# Patient Record
Sex: Female | Born: 1970 | Race: Black or African American | Hispanic: No | Marital: Married | Smoking: Current every day smoker
Health system: Southern US, Community
[De-identification: ages and names within clinical notes are randomized; demographics above are authoritative.]

## PROBLEM LIST (undated history)

## (undated) DIAGNOSIS — J40 Bronchitis, not specified as acute or chronic: Secondary | ICD-10-CM

## (undated) DIAGNOSIS — F32A Depression, unspecified: Secondary | ICD-10-CM

## (undated) DIAGNOSIS — M51369 Other intervertebral disc degeneration, lumbar region without mention of lumbar back pain or lower extremity pain: Secondary | ICD-10-CM

## (undated) DIAGNOSIS — R200 Anesthesia of skin: Secondary | ICD-10-CM

## (undated) DIAGNOSIS — I10 Essential (primary) hypertension: Secondary | ICD-10-CM

## (undated) DIAGNOSIS — M5136 Other intervertebral disc degeneration, lumbar region: Secondary | ICD-10-CM

## (undated) DIAGNOSIS — M1990 Unspecified osteoarthritis, unspecified site: Secondary | ICD-10-CM

## (undated) DIAGNOSIS — F329 Major depressive disorder, single episode, unspecified: Secondary | ICD-10-CM

## (undated) DIAGNOSIS — F419 Anxiety disorder, unspecified: Secondary | ICD-10-CM

## (undated) HISTORY — PX: DILATION AND CURETTAGE OF UTERUS: SHX78

## (undated) HISTORY — PX: OTHER SURGICAL HISTORY: SHX169

---

## 1998-06-06 ENCOUNTER — Emergency Department (HOSPITAL_COMMUNITY): Admission: EM | Admit: 1998-06-06 | Discharge: 1998-06-06 | Payer: Self-pay | Admitting: Emergency Medicine

## 1999-04-08 ENCOUNTER — Emergency Department (HOSPITAL_COMMUNITY): Admission: EM | Admit: 1999-04-08 | Discharge: 1999-04-08 | Payer: Self-pay | Admitting: Emergency Medicine

## 1999-11-06 ENCOUNTER — Ambulatory Visit (HOSPITAL_COMMUNITY): Admission: RE | Admit: 1999-11-06 | Discharge: 1999-11-06 | Payer: Self-pay | Admitting: Internal Medicine

## 1999-11-06 ENCOUNTER — Encounter: Payer: Self-pay | Admitting: Internal Medicine

## 1999-11-07 ENCOUNTER — Encounter: Payer: Self-pay | Admitting: Internal Medicine

## 2001-08-14 ENCOUNTER — Encounter: Admission: RE | Admit: 2001-08-14 | Discharge: 2001-08-14 | Payer: Self-pay | Admitting: Sports Medicine

## 2001-08-14 ENCOUNTER — Encounter: Payer: Self-pay | Admitting: Sports Medicine

## 2001-09-18 ENCOUNTER — Encounter: Admission: RE | Admit: 2001-09-18 | Discharge: 2001-09-18 | Payer: Self-pay | Admitting: Sports Medicine

## 2001-09-18 ENCOUNTER — Encounter: Payer: Self-pay | Admitting: Sports Medicine

## 2001-10-02 ENCOUNTER — Encounter: Admission: RE | Admit: 2001-10-02 | Discharge: 2001-10-02 | Payer: Self-pay | Admitting: Sports Medicine

## 2001-10-02 ENCOUNTER — Encounter: Payer: Self-pay | Admitting: Sports Medicine

## 2001-11-04 ENCOUNTER — Encounter: Admission: RE | Admit: 2001-11-04 | Discharge: 2001-12-10 | Payer: Self-pay | Admitting: Sports Medicine

## 2002-02-03 ENCOUNTER — Encounter
Admission: RE | Admit: 2002-02-03 | Discharge: 2002-05-04 | Payer: Self-pay | Admitting: Physical Medicine & Rehabilitation

## 2002-09-21 ENCOUNTER — Encounter
Admission: RE | Admit: 2002-09-21 | Discharge: 2002-12-20 | Payer: Self-pay | Admitting: Physical Medicine & Rehabilitation

## 2002-12-18 ENCOUNTER — Encounter
Admission: RE | Admit: 2002-12-18 | Discharge: 2003-03-18 | Payer: Self-pay | Admitting: Physical Medicine & Rehabilitation

## 2005-03-22 ENCOUNTER — Emergency Department (HOSPITAL_COMMUNITY): Admission: EM | Admit: 2005-03-22 | Discharge: 2005-03-22 | Payer: Self-pay | Admitting: Emergency Medicine

## 2005-07-18 ENCOUNTER — Emergency Department (HOSPITAL_COMMUNITY): Admission: EM | Admit: 2005-07-18 | Discharge: 2005-07-18 | Payer: Self-pay | Admitting: Emergency Medicine

## 2007-01-27 ENCOUNTER — Emergency Department (HOSPITAL_COMMUNITY): Admission: EM | Admit: 2007-01-27 | Discharge: 2007-01-27 | Payer: Self-pay | Admitting: Emergency Medicine

## 2008-05-07 ENCOUNTER — Encounter: Admission: RE | Admit: 2008-05-07 | Discharge: 2008-06-21 | Payer: Self-pay | Admitting: Anesthesiology

## 2008-10-26 ENCOUNTER — Inpatient Hospital Stay (HOSPITAL_COMMUNITY): Admission: AD | Admit: 2008-10-26 | Discharge: 2008-10-26 | Payer: Self-pay | Admitting: Obstetrics & Gynecology

## 2008-11-13 ENCOUNTER — Inpatient Hospital Stay (HOSPITAL_COMMUNITY): Admission: AD | Admit: 2008-11-13 | Discharge: 2008-11-13 | Payer: Self-pay | Admitting: Obstetrics & Gynecology

## 2008-12-08 ENCOUNTER — Ambulatory Visit (HOSPITAL_COMMUNITY): Admission: RE | Admit: 2008-12-08 | Discharge: 2008-12-08 | Payer: Self-pay | Admitting: Obstetrics

## 2009-02-21 ENCOUNTER — Inpatient Hospital Stay (HOSPITAL_COMMUNITY): Admission: RE | Admit: 2009-02-21 | Discharge: 2009-02-28 | Payer: Self-pay | Admitting: Obstetrics

## 2009-04-29 ENCOUNTER — Inpatient Hospital Stay (HOSPITAL_COMMUNITY): Admission: AD | Admit: 2009-04-29 | Discharge: 2009-04-29 | Payer: Self-pay | Admitting: Obstetrics & Gynecology

## 2009-04-30 ENCOUNTER — Inpatient Hospital Stay (HOSPITAL_COMMUNITY): Admission: AD | Admit: 2009-04-30 | Discharge: 2009-05-04 | Payer: Self-pay | Admitting: Obstetrics & Gynecology

## 2009-05-01 ENCOUNTER — Encounter: Payer: Self-pay | Admitting: Obstetrics & Gynecology

## 2009-08-05 ENCOUNTER — Ambulatory Visit (HOSPITAL_COMMUNITY): Admission: RE | Admit: 2009-08-05 | Discharge: 2009-08-05 | Payer: Self-pay | Admitting: Obstetrics & Gynecology

## 2009-11-24 ENCOUNTER — Ambulatory Visit (HOSPITAL_COMMUNITY): Admission: RE | Admit: 2009-11-24 | Discharge: 2009-11-24 | Payer: Self-pay | Admitting: Obstetrics & Gynecology

## 2010-03-07 ENCOUNTER — Encounter
Admission: RE | Admit: 2010-03-07 | Discharge: 2010-03-07 | Payer: Self-pay | Source: Home / Self Care | Attending: Internal Medicine | Admitting: Internal Medicine

## 2010-04-15 ENCOUNTER — Encounter: Payer: Self-pay | Admitting: Internal Medicine

## 2010-04-16 ENCOUNTER — Encounter: Payer: Self-pay | Admitting: Internal Medicine

## 2010-06-13 LAB — CBC
Hemoglobin: 11.7 g/dL — ABNORMAL LOW (ref 12.0–15.0)
Hemoglobin: 12.4 g/dL (ref 12.0–15.0)
MCHC: 34.3 g/dL (ref 30.0–36.0)
MCV: 89.2 fL (ref 78.0–100.0)
Platelets: 418 10*3/uL — ABNORMAL HIGH (ref 150–400)
RBC: 3.84 MIL/uL — ABNORMAL LOW (ref 3.87–5.11)
RBC: 4.1 MIL/uL (ref 3.87–5.11)
RDW: 16.2 % — ABNORMAL HIGH (ref 11.5–15.5)
RDW: 16.7 % — ABNORMAL HIGH (ref 11.5–15.5)
WBC: 7.8 10*3/uL (ref 4.0–10.5)

## 2010-06-13 LAB — BASIC METABOLIC PANEL
CO2: 30 mEq/L (ref 19–32)
Calcium: 8.6 mg/dL (ref 8.4–10.5)
Calcium: 8.9 mg/dL (ref 8.4–10.5)
Chloride: 105 mEq/L (ref 96–112)
Creatinine, Ser: 1.04 mg/dL (ref 0.4–1.2)
GFR calc Af Amer: >60 mL/min (ref >60)
GFR calc non Af Amer: 59 mL/min — ABNORMAL LOW (ref >60)
Potassium: 3.2 mEq/L — ABNORMAL LOW (ref 3.5–5.1)
Sodium: 140 mEq/L (ref 135–145)

## 2010-06-14 LAB — COMPREHENSIVE METABOLIC PANEL
ALT: 15 U/L (ref 0–35)
ALT: 15 U/L (ref 0–35)
AST: 27 U/L (ref 0–37)
Albumin: 2.8 g/dL — ABNORMAL LOW (ref 3.5–5.2)
Alkaline Phosphatase: 200 U/L — ABNORMAL HIGH (ref 39–117)
BUN: 4 mg/dL — ABNORMAL LOW (ref 6–23)
CO2: 28 mEq/L (ref 19–32)
Calcium: 7.1 mg/dL — ABNORMAL LOW (ref 8.4–10.5)
Calcium: 8.8 mg/dL (ref 8.4–10.5)
GFR calc non Af Amer: >60 mL/min (ref >60)
Glucose, Bld: 115 mg/dL — ABNORMAL HIGH (ref 70–99)
Potassium: 4.1 mEq/L (ref 3.5–5.1)
Total Bilirubin: 0.3 mg/dL (ref 0.3–1.2)
Total Bilirubin: <0.3 mg/dL — ABNORMAL LOW (ref 0.3–1.2)
Total Protein: 5 g/dL — ABNORMAL LOW (ref 6.0–8.3)

## 2010-06-14 LAB — CBC
HCT: 25.1 % — ABNORMAL LOW (ref 36.0–46.0)
HCT: 36.7 % (ref 36.0–46.0)
Hemoglobin: 8.4 g/dL — ABNORMAL LOW (ref 12.0–15.0)
MCHC: 33.1 g/dL (ref 30.0–36.0)
MCV: 88.8 fL (ref 78.0–100.0)
Platelets: 279 10*3/uL (ref 150–400)
Platelets: 353 10*3/uL (ref 150–400)
Platelets: 360 10*3/uL (ref 150–400)
RBC: 2.81 MIL/uL — ABNORMAL LOW (ref 3.87–5.11)
RBC: 4 MIL/uL (ref 3.87–5.11)
RDW: 13.9 % (ref 11.5–15.5)
WBC: 13.1 10*3/uL — ABNORMAL HIGH (ref 4.0–10.5)

## 2010-06-14 LAB — RPR: RPR Ser Ql: NONREACTIVE

## 2010-06-14 LAB — URIC ACID: Uric Acid, Serum: 4.5 mg/dL (ref 2.4–7.0)

## 2010-06-14 LAB — LACTATE DEHYDROGENASE: LDH: 186 U/L (ref 94–250)

## 2010-06-14 LAB — MRSA PCR SCREENING: MRSA by PCR: NEGATIVE

## 2010-06-28 LAB — WET PREP, GENITAL
Trich, Wet Prep: NONE SEEN
Yeast Wet Prep HPF POC: NONE SEEN

## 2010-06-28 LAB — URINALYSIS, ROUTINE W REFLEX MICROSCOPIC
Bilirubin Urine: NEGATIVE
Hgb urine dipstick: NEGATIVE
Specific Gravity, Urine: 1.015 (ref 1.005–1.030)
pH: 7 (ref 5.0–8.0)

## 2010-06-28 LAB — URINE CULTURE

## 2010-07-02 LAB — URINE MICROSCOPIC-ADD ON

## 2010-07-02 LAB — URINALYSIS, ROUTINE W REFLEX MICROSCOPIC
Glucose, UA: NEGATIVE mg/dL
Specific Gravity, Urine: >1.03 — ABNORMAL HIGH (ref 1.005–1.030)
Urobilinogen, UA: 1 mg/dL (ref 0.0–1.0)

## 2010-07-02 LAB — COMPREHENSIVE METABOLIC PANEL
Albumin: 3.3 g/dL — ABNORMAL LOW (ref 3.5–5.2)
BUN: 7 mg/dL (ref 6–23)
Chloride: 109 mEq/L (ref 96–112)
Creatinine, Ser: 0.56 mg/dL (ref 0.4–1.2)
Glucose, Bld: 83 mg/dL (ref 70–99)
Total Bilirubin: 0.3 mg/dL (ref 0.3–1.2)

## 2010-07-02 LAB — GC/CHLAMYDIA PROBE AMP, GENITAL: GC Probe Amp, Genital: NEGATIVE

## 2010-07-02 LAB — ABO/RH: ABO/RH(D): B POS

## 2010-07-02 LAB — HCG, QUANTITATIVE, PREGNANCY: hCG, Beta Chain, Quant, S: 55838 m[IU]/mL — ABNORMAL HIGH (ref <5)

## 2010-08-11 NOTE — Assessment & Plan Note (Signed)
HISTORY:  The patient had a left S1 transforaminal epidural steroid  injection on December 21, 2002.  She states that she had increased  functional activities, decreased pain, medicine utilization following the  injection for several days.  Her pain has gradually returned to a pre-  injection level.  Her pain is mainly in the left lower extremity, and is  worse with walking, bending, sitting and prolonged standing.  It improves  with rest and ice therapy and medications.  The pain level is 10 throughout  the left foot.  Has extra X's, but the pain diagram just shows the left  lower extremity and buttock.   REVIEW OF SYSTEMS:  Depression, but no suicidal ideations.  No bowel or  bladder problems.   SOCIAL HISTORY:  Smokes one pack a day.  She is single and lives with her  son.   MEDICATIONS:  She is currently out of her medication.  She was on Flexeril 5  mg t.i.d., Naprosyn 500 mg b.i.d. and nortriptyline 25 mg q. h.s.   PHYSICAL EXAMINATION:  VITAL SIGNS:  Blood pressure 117/77, pulse 66,  respirations 16, O2 saturation 97%.  GENERAL:  An obese female, in no acute distress.  Mood and affect are  bright, laughing at times.  BACK/NEUROLOGIC:  Has no tenderness to palpation.  She has pain with forward  flexion.  She has no pain with lower extremity range of motion.  She has  decreased sensation on the left foot as compared to the right, particularly  S1.  She has 1+ deep tendon reflexes bilateral lower extremities in the  ankles and knees.   IMPRESSION:  Left S1 chronic radiculopathy in a patient with L5-S1  spondylolisthesis.   PLAN:  1. Will restart the Flexeril and Pamelor and hold off on the Naprosyn for     now.  2. Repeat left S1 selective nerve root injection, transforaminal epidural     steroid injection.  Next time we do the injection, it would be a bilateral S1, versus the left  side, depending upon the symptomatology.      Erick Colace, M.D.   [facility]  D:  03/09/2003 10:25:39  T:  03/09/2003 10:52:23  Job #:  [id number]   cc:   Donalee Citrin, M.D.  [address]  [place]  [place] [address]  Fax: [phone]   Rehabilitation   Dr. Glenetta Borg  -  [facility]

## 2011-02-12 ENCOUNTER — Other Ambulatory Visit: Payer: Self-pay | Admitting: Internal Medicine

## 2011-02-12 DIAGNOSIS — Z1231 Encounter for screening mammogram for malignant neoplasm of breast: Secondary | ICD-10-CM

## 2011-03-14 ENCOUNTER — Ambulatory Visit: Payer: Self-pay

## 2011-03-28 ENCOUNTER — Ambulatory Visit
Admission: RE | Admit: 2011-03-28 | Discharge: 2011-03-28 | Disposition: A | Discharge status: Home / Self Care | Payer: Medicaid Other | Source: Ambulatory Visit | Attending: Internal Medicine | Admitting: Internal Medicine

## 2011-03-28 DIAGNOSIS — Z1231 Encounter for screening mammogram for malignant neoplasm of breast: Secondary | ICD-10-CM

## 2011-10-25 ENCOUNTER — Other Ambulatory Visit: Payer: Self-pay | Admitting: Internal Medicine

## 2011-10-25 DIAGNOSIS — N920 Excessive and frequent menstruation with regular cycle: Secondary | ICD-10-CM

## 2011-10-29 ENCOUNTER — Ambulatory Visit
Admission: RE | Admit: 2011-10-29 | Discharge: 2011-10-29 | Disposition: A | Discharge status: Home / Self Care | Payer: Medicaid Other | Source: Ambulatory Visit | Attending: Internal Medicine | Admitting: Internal Medicine

## 2011-10-29 DIAGNOSIS — N920 Excessive and frequent menstruation with regular cycle: Secondary | ICD-10-CM

## 2012-02-25 ENCOUNTER — Other Ambulatory Visit: Payer: Self-pay | Admitting: Internal Medicine

## 2012-02-25 DIAGNOSIS — Z1231 Encounter for screening mammogram for malignant neoplasm of breast: Secondary | ICD-10-CM

## 2012-02-28 ENCOUNTER — Other Ambulatory Visit: Payer: Self-pay | Admitting: Obstetrics & Gynecology

## 2012-04-01 ENCOUNTER — Inpatient Hospital Stay: Admission: RE | Admit: 2012-04-01 | Payer: Medicaid Other | Source: Ambulatory Visit

## 2012-04-02 ENCOUNTER — Other Ambulatory Visit: Payer: Self-pay | Admitting: Internal Medicine

## 2012-04-02 DIAGNOSIS — Z1231 Encounter for screening mammogram for malignant neoplasm of breast: Secondary | ICD-10-CM

## 2012-04-21 ENCOUNTER — Encounter (HOSPITAL_COMMUNITY): Payer: Self-pay | Admitting: Pharmacy Technician

## 2012-04-22 ENCOUNTER — Encounter (HOSPITAL_COMMUNITY): Payer: Self-pay | Admitting: Pharmacy Technician

## 2012-04-22 ENCOUNTER — Other Ambulatory Visit (HOSPITAL_COMMUNITY): Payer: Self-pay | Admitting: Obstetrics & Gynecology

## 2012-04-22 ENCOUNTER — Inpatient Hospital Stay (HOSPITAL_COMMUNITY): Admission: RE | Admit: 2012-04-22 | Discharge: 2012-04-22 | Payer: Medicaid Other | Source: Ambulatory Visit

## 2012-04-22 DIAGNOSIS — D500 Iron deficiency anemia secondary to blood loss (chronic): Secondary | ICD-10-CM | POA: Diagnosis present

## 2012-04-22 DIAGNOSIS — N946 Dysmenorrhea, unspecified: Secondary | ICD-10-CM | POA: Diagnosis present

## 2012-04-22 DIAGNOSIS — D219 Benign neoplasm of connective and other soft tissue, unspecified: Secondary | ICD-10-CM | POA: Diagnosis present

## 2012-04-22 DIAGNOSIS — N920 Excessive and frequent menstruation with regular cycle: Secondary | ICD-10-CM | POA: Diagnosis present

## 2012-04-22 NOTE — H&P (Signed)
  Subjective:  Kathleen Guzman is a 42 y.o. gravida 3 para 2, female.  I was consulted regarding irregular bleeding.  Onset of symptoms was several years ago with unchanged course since that time. Bleeding is characterized as heavy.   Associated symptoms include pelvic pain.  Evaluation to date: pelvic ultrasound: positive for uterine fibroids.  The uterus had a sagittal diameter of 10 cm.  Treatment to date: iron therapy for anemia and a minipill  Pertinent Gyn History:  Menses flow is heavy w/cramping  Contraception: tubal ligation  Preventive screening:  Last mammogram: normal Date: 1/13 Last pap: normal Date: 2012  Patient Active Problem List   Diagnosis Date Noted  . Fibroids 04/22/2012  . Excessive or frequent menstruation 04/22/2012  . Dysmenorrhea 04/22/2012  . Iron deficiency anemia due to chronic blood loss 04/22/2012   No past medical history on file.  No past surgical history on file.  No prescriptions prior to admission   No Known Allergies  History  Substance Use Topics  . Smoking status: Not on file  . Smokeless tobacco: Not on file  . Alcohol Use: Not on file    No family history on file.   Review of Systems Pertinent items are noted in HPI.    Objective:   Vital signs in last 24 hours:    General:   alert  Skin:   normal  HEENT:  PERRLA  Lungs:   clear to auscultation bilaterally  Heart:   regular rate and rhythm, S1, S2 normal, no murmur, click, rub or gallop  Breasts:   normal without suspicious masses, skin or nipple changes or axillary nodes  Abdomen:  soft, non-tender; bowel sounds normal; no masses,  no organomegaly  Pelvis:  Exam limited by body habitus     Assessment/Plan:  AUB--L, secondary iron deficiency anemia, refractory to medical management Dysmenorrhea--low suspicion for endometriosis    I had a lengthy discussion with the patient regarding her bleeding and consideration for endometrial ablation versus hysterectomy.  Procedure,  risks, reasons, benefits and complications (including injury to bowel, bladder, major blood vessel, ureter, bleeding, possibility of transfusion, infection, or fistula formation) were reviewed in detail. The risks are heightened by the obesity.  Consent was signed and preop testing was ordered.  Instructions were reviewed, including NPO after midnight.

## 2012-04-22 NOTE — Patient Instructions (Signed)
Hannie DECLYN OFFIELD  04/22/2012   Your procedure is scheduled on: 04/25/12   Report to [facility] Stay [facility] at 0530 AM.  Call this number if you have problems the morning of surgery: [phone]   Remember:   Do not eat food or drink liquids after midnight.   Take these medicines the morning of surgery with A SIP OF WATER:    Do not wear jewelry, make-up or nail polish.  Do not wear lotions, powders, or perfumes. .  Do not shave 48 hours prior to surgery.   Do not bring valuables to the hospital.  Contacts, dentures or bridgework may not be worn into surgery.  Leave suitcase in the car. After surgery it may be brought to your room.  For patients admitted to the hospital, checkout time is 11:00 AM the day of  discharge.              SEE [facility] INSTRUCTION SHEET    Please read over the following fact sheets that you were given: MRSA Information, Blood Transfusion Fact Sheet, coughing and deep breathing exercises, leg exercises.              Failure to comply with these instructions may result in cancellation of your surgery.               Patient Signature __________________________             Nurse Signature ____________________________

## 2012-04-22 NOTE — Patient Instructions (Signed)
[name]  04/22/2012   Your procedure is scheduled on: 04-25-2012  Report to [facility] at  0530 AM.  Call this number if you have problems the morning of surgery [phone]   Remember:   Do not eat food or drink liquids :After Midnight.     Take these medicines the morning of surgery with A SIP OF WATER:                                 SEE [facility] PREPARING FOR SURGERY SHEET   Do not wear jewelry, make-up or nail polish.  Do not wear lotions, powders, or perfumes. You may wear deodorant.   Men may shave face and neck.  Do not bring valuables to the hospital.  Contacts, dentures or bridgework may not be worn into surgery.  Leave suitcase in the car. After surgery it may be brought to your room.  For patients admitted to the hospital, checkout time is 11:00 AM the day of discharge.   Patients discharged the day of surgery will not be allowed to drive home.  Name and phone number of your driver:  Special Instructions: N/A   Please read over the following fact sheets that you were given: MRSA Information., blood fact sheet Call Kathleen Sieve RN pre op nurse if needed [phone]    FAILURE TO FOLLOW THESE INSTRUCTIONS MAY RESULT IN THE CANCELLATION OF YOUR SURGERY. PATIENT SIGNATURE___________________________________________

## 2012-04-23 ENCOUNTER — Inpatient Hospital Stay (HOSPITAL_COMMUNITY): Admission: RE | Admit: 2012-04-23 | Discharge: 2012-04-23 | Payer: Medicaid Other | Source: Ambulatory Visit
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
## 2012-04-24 ENCOUNTER — Ambulatory Visit (HOSPITAL_COMMUNITY)
Admission: RE | Admit: 2012-04-24 | Discharge: 2012-04-24 | Disposition: A | Discharge status: Home / Self Care | Payer: Medicaid Other | Source: Ambulatory Visit | Attending: Obstetrics & Gynecology | Admitting: Obstetrics & Gynecology

## 2012-04-24 ENCOUNTER — Encounter (HOSPITAL_COMMUNITY): Payer: Self-pay

## 2012-04-24 ENCOUNTER — Ambulatory Visit: Payer: Medicaid Other

## 2012-04-24 ENCOUNTER — Encounter (HOSPITAL_COMMUNITY)
Admission: RE | Admit: 2012-04-24 | Discharge: 2012-04-24 | Disposition: A | Discharge status: Home / Self Care | Payer: Medicaid Other | Source: Ambulatory Visit | Attending: Obstetrics & Gynecology | Admitting: Obstetrics & Gynecology

## 2012-04-24 ENCOUNTER — Ambulatory Visit
Admission: RE | Admit: 2012-04-24 | Discharge: 2012-04-24 | Disposition: A | Discharge status: Home / Self Care | Payer: Medicaid Other | Source: Ambulatory Visit | Attending: Internal Medicine | Admitting: Internal Medicine

## 2012-04-24 DIAGNOSIS — Z01812 Encounter for preprocedural laboratory examination: Secondary | ICD-10-CM | POA: Insufficient documentation

## 2012-04-24 DIAGNOSIS — Z01810 Encounter for preprocedural cardiovascular examination: Secondary | ICD-10-CM | POA: Insufficient documentation

## 2012-04-24 DIAGNOSIS — R9431 Abnormal electrocardiogram [ECG] [EKG]: Secondary | ICD-10-CM | POA: Insufficient documentation

## 2012-04-24 DIAGNOSIS — Z01818 Encounter for other preprocedural examination: Secondary | ICD-10-CM | POA: Insufficient documentation

## 2012-04-24 DIAGNOSIS — Z1231 Encounter for screening mammogram for malignant neoplasm of breast: Secondary | ICD-10-CM

## 2012-04-24 HISTORY — DX: Anesthesia of skin: R20.0

## 2012-04-24 HISTORY — DX: Essential (primary) hypertension: I10

## 2012-04-24 HISTORY — DX: Unspecified osteoarthritis, unspecified site: M19.90

## 2012-04-24 LAB — TYPE AND SCREEN
ABO/RH(D): B POS
Antibody Screen: NEGATIVE

## 2012-04-24 LAB — HCG, SERUM, QUALITATIVE: Preg, Serum: NEGATIVE

## 2012-04-24 LAB — BASIC METABOLIC PANEL
GFR calc non Af Amer: 80 mL/min — ABNORMAL LOW (ref >90)
Glucose, Bld: 93 mg/dL (ref 70–99)
Potassium: 4.3 mEq/L (ref 3.5–5.1)
Sodium: 139 mEq/L (ref 135–145)

## 2012-04-24 LAB — CBC
Hemoglobin: 10.5 g/dL — ABNORMAL LOW (ref 12.0–15.0)
MCH: 23 pg — ABNORMAL LOW (ref 26.0–34.0)
RBC: 4.57 MIL/uL (ref 3.87–5.11)
WBC: 7.1 10*3/uL (ref 4.0–10.5)

## 2012-04-24 LAB — SURGICAL PCR SCREEN
MRSA, PCR: INVALID — AB
Staphylococcus aureus: INVALID — AB

## 2012-04-25 ENCOUNTER — Encounter (HOSPITAL_COMMUNITY): Admission: RE | Payer: Self-pay | Source: Ambulatory Visit

## 2012-04-25 ENCOUNTER — Ambulatory Visit (HOSPITAL_COMMUNITY)
Admission: RE | Admit: 2012-04-25 | Payer: Medicaid Other | Source: Ambulatory Visit | Admitting: Obstetrics & Gynecology

## 2012-04-25 SURGERY — ROBOTIC ASSISTED TOTAL HYSTERECTOMY
Anesthesia: Choice | Laterality: Bilateral

## 2012-04-27 LAB — MRSA CULTURE

## 2012-06-10 ENCOUNTER — Encounter: Payer: Self-pay | Admitting: Obstetrics & Gynecology

## 2012-06-16 ENCOUNTER — Telehealth: Payer: Self-pay | Admitting: *Deleted

## 2012-06-18 ENCOUNTER — Encounter (HOSPITAL_COMMUNITY): Payer: Self-pay | Admitting: Pharmacy Technician

## 2012-06-19 NOTE — Telephone Encounter (Signed)
Call with new contact #

## 2012-06-20 ENCOUNTER — Inpatient Hospital Stay (HOSPITAL_COMMUNITY): Admission: RE | Admit: 2012-06-20 | Payer: Medicaid Other | Source: Ambulatory Visit

## 2012-06-25 NOTE — H&P (Signed)
   Subjective:  Kathleen Guzman is a 42 y.o. gravida 3 para 2, female.  I was consulted regarding irregular bleeding.  Onset of symptoms was several years ago with unchanged course since that time. Bleeding is characterized as heavy.   Associated symptoms include pelvic pain.  Evaluation to date: pelvic ultrasound: positive for uterine fibroids.  The uterus had a sagittal diameter of 10 cm.  Treatment to date: iron therapy for anemia and a minipill  Pertinent Gyn History:  Menses flow is heavy w/cramping  Contraception: tubal ligation  Preventive screening:  Last mammogram: normal Date: 1/13 Last pap: normal Date: 2012  Patient Active Problem List   Diagnosis Date Noted  . Fibroids 04/22/2012  . Excessive or frequent menstruation 04/22/2012  . Dysmenorrhea 04/22/2012  . Iron deficiency anemia due to chronic blood loss 04/22/2012   Past Medical History  Diagnosis Date  . Hypertension   . Numbness in both legs   . Arthritis     Past Surgical History  Procedure Laterality Date  . Cesarian     . Cesarean section      No prescriptions prior to admission   No Known Allergies  History  Substance Use Topics  . Smoking status: Current Every Day Smoker -- 0.50 packs/day  . Smokeless tobacco: Not on file  . Alcohol Use: Yes     Comment: occasional    No family history on file.   Review of Systems Pertinent items are noted in HPI.    Objective:       General:   alert  Skin:   normal  HEENT:  PERRLA  Lungs:   clear to auscultation bilaterally  Heart:   regular rate and rhythm, S1, S2 normal, no murmur, click, rub or gallop  Breasts:   normal without suspicious masses, skin or nipple changes or axillary nodes  Abdomen:  soft, non-tender; bowel sounds normal; no masses,  no organomegaly  Pelvis:  Exam limited by body habitus     Assessment/Plan:  AUB--L, secondary iron deficiency anemia, refractory to medical management Dysmenorrhea--low suspicion for endometriosis     I had a lengthy discussion with the patient regarding her bleeding and consideration for endometrial ablation versus hysterectomy.  Procedure, risks, reasons, benefits and complications (including injury to bowel, bladder, major blood vessel, ureter, bleeding, possibility of transfusion, infection, or fistula formation) were reviewed in detail. The risks are heightened by the obesity.  Consent was signed and preop testing was ordered.  Instructions were reviewed, including NPO after midnight.

## 2012-06-26 ENCOUNTER — Encounter (HOSPITAL_COMMUNITY): Payer: Self-pay

## 2012-06-26 ENCOUNTER — Other Ambulatory Visit (HOSPITAL_COMMUNITY): Payer: Self-pay | Admitting: *Deleted

## 2012-06-26 ENCOUNTER — Encounter (HOSPITAL_COMMUNITY)
Admission: RE | Admit: 2012-06-26 | Discharge: 2012-06-26 | Disposition: A | Discharge status: Home / Self Care | Payer: Medicaid Other | Source: Ambulatory Visit | Attending: Obstetrics & Gynecology | Admitting: Obstetrics & Gynecology

## 2012-06-26 ENCOUNTER — Other Ambulatory Visit (HOSPITAL_COMMUNITY): Payer: Medicaid Other

## 2012-06-26 HISTORY — DX: Other intervertebral disc degeneration, lumbar region: M51.36

## 2012-06-26 HISTORY — DX: Major depressive disorder, single episode, unspecified: F32.9

## 2012-06-26 HISTORY — DX: Anxiety disorder, unspecified: F41.9

## 2012-06-26 HISTORY — DX: Depression, unspecified: F32.A

## 2012-06-26 HISTORY — DX: Other intervertebral disc degeneration, lumbar region without mention of lumbar back pain or lower extremity pain: M51.369

## 2012-06-26 LAB — BASIC METABOLIC PANEL
BUN: 7 mg/dL (ref 6–23)
CO2: 25 mEq/L (ref 19–32)
Chloride: 105 mEq/L (ref 96–112)
GFR calc Af Amer: >90 mL/min (ref >90)
Glucose, Bld: 106 mg/dL — ABNORMAL HIGH (ref 70–99)
Potassium: 4 mEq/L (ref 3.5–5.1)

## 2012-06-26 LAB — CBC
HCT: 36.7 % (ref 36.0–46.0)
Hemoglobin: 11.9 g/dL — ABNORMAL LOW (ref 12.0–15.0)
MCHC: 32.4 g/dL (ref 30.0–36.0)

## 2012-06-26 LAB — SURGICAL PCR SCREEN: Staphylococcus aureus: POSITIVE — AB

## 2012-06-26 NOTE — Progress Notes (Signed)
Attempted to call pt on primary #-no answer & no voicemail re: pcr results

## 2012-06-26 NOTE — Patient Instructions (Addendum)
Kathleen Guzman  06/26/2012                           YOUR PROCEDURE IS SCHEDULED ON: 06/27/12               PLEASE REPORT TO SHORT STAY CENTER AT : 9:00 AM               CALL THIS NUMBER IF ANY PROBLEMS THE DAY OF SURGERY :               [phone]                      REMEMBER:   Do not eat food or drink liquids AFTER MIDNIGHT   Take these medicines the morning of surgery with A SIP OF WATER: CLARITIN / FLEXERIL /  OXYCODONE IF NEEDED   Do not wear jewelry, make-up   Do not wear lotions, powders, or perfumes.   Do not shave legs or underarms 12 hrs. before surgery (men may shave face)  Do not bring valuables to the hospital.  Contacts, dentures or bridgework may not be worn into surgery.  Leave suitcase in the car. After surgery it may be brought to your room.  For patients admitted to the hospital more than one night, checkout time is 11:00                          The day of discharge.   Patients discharged the day of surgery will not be allowed to drive home                             If going home same day of surgery, must have someone stay with you first                           24 hrs at home and arrange for some one to drive you home from hospital.    Special Instructions:   Please read over the following fact sheets that you were given:               1. MRSA  INFORMATION                      2. [facility] PREPARING FOR SURGERY SHEET                                                X_____________________________________________________________________        Failure to follow these instructions may result in cancellation of your surgery

## 2012-06-27 ENCOUNTER — Ambulatory Visit (HOSPITAL_COMMUNITY): Payer: Medicaid Other | Admitting: Anesthesiology

## 2012-06-27 ENCOUNTER — Encounter (HOSPITAL_COMMUNITY)
Admission: RE | Disposition: A | Discharge status: Home / Self Care | Payer: Self-pay | Source: Ambulatory Visit | Attending: Obstetrics & Gynecology

## 2012-06-27 ENCOUNTER — Encounter (HOSPITAL_COMMUNITY): Payer: Self-pay | Admitting: Anesthesiology

## 2012-06-27 ENCOUNTER — Encounter (HOSPITAL_COMMUNITY): Payer: Self-pay | Admitting: *Deleted

## 2012-06-27 ENCOUNTER — Ambulatory Visit (HOSPITAL_COMMUNITY)
Admission: RE | Admit: 2012-06-27 | Discharge: 2012-06-29 | Disposition: A | Discharge status: Home / Self Care | Payer: Medicaid Other | Source: Ambulatory Visit | Attending: Obstetrics & Gynecology | Admitting: Obstetrics & Gynecology

## 2012-06-27 DIAGNOSIS — Z6841 Body Mass Index (BMI) 40.0 and over, adult: Secondary | ICD-10-CM | POA: Insufficient documentation

## 2012-06-27 DIAGNOSIS — D259 Leiomyoma of uterus, unspecified: Secondary | ICD-10-CM | POA: Insufficient documentation

## 2012-06-27 DIAGNOSIS — I10 Essential (primary) hypertension: Secondary | ICD-10-CM | POA: Insufficient documentation

## 2012-06-27 DIAGNOSIS — D219 Benign neoplasm of connective and other soft tissue, unspecified: Secondary | ICD-10-CM

## 2012-06-27 DIAGNOSIS — F411 Generalized anxiety disorder: Secondary | ICD-10-CM | POA: Insufficient documentation

## 2012-06-27 DIAGNOSIS — F329 Major depressive disorder, single episode, unspecified: Secondary | ICD-10-CM | POA: Insufficient documentation

## 2012-06-27 DIAGNOSIS — D62 Acute posthemorrhagic anemia: Secondary | ICD-10-CM | POA: Insufficient documentation

## 2012-06-27 DIAGNOSIS — Z01812 Encounter for preprocedural laboratory examination: Secondary | ICD-10-CM | POA: Insufficient documentation

## 2012-06-27 DIAGNOSIS — N920 Excessive and frequent menstruation with regular cycle: Secondary | ICD-10-CM

## 2012-06-27 DIAGNOSIS — R110 Nausea: Secondary | ICD-10-CM | POA: Insufficient documentation

## 2012-06-27 DIAGNOSIS — F3289 Other specified depressive episodes: Secondary | ICD-10-CM | POA: Insufficient documentation

## 2012-06-27 DIAGNOSIS — F17200 Nicotine dependence, unspecified, uncomplicated: Secondary | ICD-10-CM | POA: Insufficient documentation

## 2012-06-27 DIAGNOSIS — N946 Dysmenorrhea, unspecified: Secondary | ICD-10-CM

## 2012-06-27 DIAGNOSIS — D500 Iron deficiency anemia secondary to blood loss (chronic): Secondary | ICD-10-CM

## 2012-06-27 HISTORY — PX: BILATERAL SALPINGECTOMY: SHX5743

## 2012-06-27 HISTORY — PX: ROBOTIC ASSISTED TOTAL HYSTERECTOMY: SHX6085

## 2012-06-27 SURGERY — ROBOTIC ASSISTED TOTAL HYSTERECTOMY
Anesthesia: General | Wound class: Clean Contaminated

## 2012-06-27 MED ORDER — LISINOPRIL 10 MG PO TABS
10.0000 mg | ORAL_TABLET | Freq: Every morning | ORAL | Status: DC
  Administered 2012-06-28 – 2012-06-29 (×2): 10 mg via ORAL
  Filled 2012-06-27 (×2): qty 1

## 2012-06-27 MED ORDER — NEOSTIGMINE METHYLSULFATE 1 MG/ML IJ SOLN
INTRAMUSCULAR | Status: DC | PRN
  Administered 2012-06-27: 4 mg via INTRAVENOUS

## 2012-06-27 MED ORDER — MORPHINE SULFATE 2 MG/ML IJ SOLN
1.0000 mg | INTRAMUSCULAR | Status: DC | PRN
  Administered 2012-06-27 – 2012-06-28 (×4): 2 mg via INTRAVENOUS
  Filled 2012-06-27 (×4): qty 1

## 2012-06-27 MED ORDER — ONDANSETRON HCL 4 MG PO TABS: 4.0000 mg | ORAL_TABLET | Freq: Four times a day (QID) | ORAL | Status: DC | PRN

## 2012-06-27 MED ORDER — KETOROLAC TROMETHAMINE 30 MG/ML IJ SOLN
30.0000 mg | Freq: Once | INTRAMUSCULAR | Status: AC
  Administered 2012-06-27: 30 mg via INTRAVENOUS

## 2012-06-27 MED ORDER — ACETAMINOPHEN 10 MG/ML IV SOLN
INTRAVENOUS | Status: AC
  Filled 2012-06-27: qty 100

## 2012-06-27 MED ORDER — LACTATED RINGERS IV SOLN: INTRAVENOUS | Status: DC

## 2012-06-27 MED ORDER — GLYCOPYRROLATE 0.2 MG/ML IJ SOLN
INTRAMUSCULAR | Status: DC | PRN
  Administered 2012-06-27: .6 mg via INTRAVENOUS

## 2012-06-27 MED ORDER — DEXTROSE 5 % IV SOLN
3.0000 g | INTRAVENOUS | Status: AC
  Administered 2012-06-27: 3 g via INTRAVENOUS
  Filled 2012-06-27: qty 3000

## 2012-06-27 MED ORDER — ZOLPIDEM TARTRATE 5 MG PO TABS
5.0000 mg | ORAL_TABLET | Freq: Every evening | ORAL | Status: DC | PRN
  Administered 2012-06-29: 5 mg via ORAL
  Filled 2012-06-27: qty 1

## 2012-06-27 MED ORDER — SUFENTANIL CITRATE 50 MCG/ML IV SOLN
INTRAVENOUS | Status: DC | PRN
  Administered 2012-06-27: 20 ug via INTRAVENOUS
  Administered 2012-06-27 (×3): 10 ug via INTRAVENOUS

## 2012-06-27 MED ORDER — MUPIROCIN 2 % EX OINT
TOPICAL_OINTMENT | Freq: Two times a day (BID) | CUTANEOUS | Status: DC
  Administered 2012-06-27: 1 via NASAL
  Filled 2012-06-27: qty 22

## 2012-06-27 MED ORDER — PROMETHAZINE HCL 25 MG/ML IJ SOLN: 6.2500 mg | INTRAMUSCULAR | Status: DC | PRN

## 2012-06-27 MED ORDER — ACETAMINOPHEN 10 MG/ML IV SOLN
INTRAVENOUS | Status: DC | PRN
  Administered 2012-06-27: 1000 mg via INTRAVENOUS

## 2012-06-27 MED ORDER — OXYCODONE-ACETAMINOPHEN 5-325 MG PO TABS
1.0000 | ORAL_TABLET | ORAL | Status: DC | PRN
  Administered 2012-06-28 – 2012-06-29 (×6): 2 via ORAL
  Filled 2012-06-27 (×3): qty 2
  Filled 2012-06-27: qty 1
  Filled 2012-06-27 (×2): qty 2
  Filled 2012-06-27: qty 1

## 2012-06-27 MED ORDER — PROPOFOL 10 MG/ML IV BOLUS
INTRAVENOUS | Status: DC | PRN
  Administered 2012-06-27: 200 mg via INTRAVENOUS

## 2012-06-27 MED ORDER — EPHEDRINE SULFATE 50 MG/ML IJ SOLN
INTRAMUSCULAR | Status: DC | PRN
  Administered 2012-06-27 (×2): 5 mg via INTRAVENOUS

## 2012-06-27 MED ORDER — HYDROMORPHONE HCL PF 1 MG/ML IJ SOLN
0.2500 mg | INTRAMUSCULAR | Status: DC | PRN
  Administered 2012-06-27 (×2): 0.5 mg via INTRAVENOUS

## 2012-06-27 MED ORDER — LACTATED RINGERS IV SOLN
INTRAVENOUS | Status: DC | PRN
  Administered 2012-06-27 (×2): via INTRAVENOUS

## 2012-06-27 MED ORDER — METRONIDAZOLE IN NACL 5-0.79 MG/ML-% IV SOLN
500.0000 mg | Freq: Once | INTRAVENOUS | Status: AC
  Administered 2012-06-27: .5 g via INTRAVENOUS
  Filled 2012-06-27: qty 100

## 2012-06-27 MED ORDER — STERILE WATER FOR IRRIGATION IR SOLN
Status: DC | PRN
  Administered 2012-06-27: 3000 mL

## 2012-06-27 MED ORDER — ONDANSETRON HCL 4 MG/2ML IJ SOLN: 4.0000 mg | Freq: Four times a day (QID) | INTRAMUSCULAR | Status: DC | PRN

## 2012-06-27 MED ORDER — BUPIVACAINE HCL (PF) 0.25 % IJ SOLN
INTRAMUSCULAR | Status: AC
  Filled 2012-06-27: qty 30

## 2012-06-27 MED ORDER — SIMETHICONE 80 MG PO CHEW
80.0000 mg | CHEWABLE_TABLET | Freq: Four times a day (QID) | ORAL | Status: DC | PRN
  Filled 2012-06-27: qty 1

## 2012-06-27 MED ORDER — INFLUENZA VIRUS VACC SPLIT PF IM SUSP
0.5000 mL | INTRAMUSCULAR | Status: AC
  Administered 2012-06-28: 0.5 mL via INTRAMUSCULAR
  Filled 2012-06-27 (×2): qty 0.5

## 2012-06-27 MED ORDER — LACTATED RINGERS IV SOLN
INTRAVENOUS | Status: DC
  Administered 2012-06-27: 1000 mL via INTRAVENOUS

## 2012-06-27 MED ORDER — HYDROMORPHONE HCL PF 1 MG/ML IJ SOLN
INTRAMUSCULAR | Status: AC
  Filled 2012-06-27: qty 1

## 2012-06-27 MED ORDER — MIDAZOLAM HCL 5 MG/5ML IJ SOLN
INTRAMUSCULAR | Status: DC | PRN
  Administered 2012-06-27: 2 mg via INTRAVENOUS

## 2012-06-27 MED ORDER — MENTHOL 3 MG MT LOZG
1.0000 | LOZENGE | OROMUCOSAL | Status: DC | PRN
  Filled 2012-06-27: qty 9

## 2012-06-27 MED ORDER — SUCCINYLCHOLINE CHLORIDE 20 MG/ML IJ SOLN
INTRAMUSCULAR | Status: DC | PRN
  Administered 2012-06-27: 100 mg via INTRAVENOUS

## 2012-06-27 MED ORDER — LIDOCAINE HCL (CARDIAC) 20 MG/ML IV SOLN
INTRAVENOUS | Status: DC | PRN
  Administered 2012-06-27: 100 mg via INTRAVENOUS

## 2012-06-27 MED ORDER — KETOROLAC TROMETHAMINE 30 MG/ML IJ SOLN
INTRAMUSCULAR | Status: AC
  Filled 2012-06-27: qty 1

## 2012-06-27 MED ORDER — LIP MEDEX EX OINT
TOPICAL_OINTMENT | CUTANEOUS | Status: AC
  Administered 2012-06-27: 16:00:00
  Filled 2012-06-27: qty 7

## 2012-06-27 MED ORDER — DEXAMETHASONE SODIUM PHOSPHATE 10 MG/ML IJ SOLN
INTRAMUSCULAR | Status: DC | PRN
  Administered 2012-06-27: 10 mg via INTRAVENOUS

## 2012-06-27 MED ORDER — KETOROLAC TROMETHAMINE 30 MG/ML IJ SOLN
30.0000 mg | Freq: Four times a day (QID) | INTRAMUSCULAR | Status: DC
  Administered 2012-06-27 – 2012-06-29 (×7): 30 mg via INTRAVENOUS
  Filled 2012-06-27 (×14): qty 1

## 2012-06-27 MED ORDER — CISATRACURIUM BESYLATE (PF) 10 MG/5ML IV SOLN
INTRAVENOUS | Status: DC | PRN
  Administered 2012-06-27: 4 mg via INTRAVENOUS
  Administered 2012-06-27: 12 mg via INTRAVENOUS
  Administered 2012-06-27: 2 mg via INTRAVENOUS

## 2012-06-27 MED ORDER — MAGNESIUM HYDROXIDE 400 MG/5ML PO SUSP
30.0000 mL | Freq: Two times a day (BID) | ORAL | Status: AC
  Administered 2012-06-27 – 2012-06-28 (×3): 30 mL via ORAL
  Filled 2012-06-27 (×3): qty 30

## 2012-06-27 MED ORDER — LACTATED RINGERS IR SOLN
Status: DC | PRN
  Administered 2012-06-27: 1000 mL

## 2012-06-27 MED ORDER — DEXTROSE-NACL 5-0.45 % IV SOLN
INTRAVENOUS | Status: DC
  Administered 2012-06-27 – 2012-06-29 (×4): via INTRAVENOUS

## 2012-06-27 MED ORDER — ONDANSETRON HCL 4 MG/2ML IJ SOLN
INTRAMUSCULAR | Status: DC | PRN
  Administered 2012-06-27: 4 mg via INTRAVENOUS

## 2012-06-27 MED ORDER — IBUPROFEN 800 MG PO TABS
800.0000 mg | ORAL_TABLET | Freq: Three times a day (TID) | ORAL | Status: DC | PRN
  Administered 2012-06-27: 800 mg via ORAL
  Filled 2012-06-27: qty 1

## 2012-06-27 MED ORDER — KETOROLAC TROMETHAMINE 30 MG/ML IJ SOLN
30.0000 mg | Freq: Four times a day (QID) | INTRAMUSCULAR | Status: DC
  Filled 2012-06-27 (×8): qty 1

## 2012-06-27 MED ORDER — BUPIVACAINE HCL 0.25 % IJ SOLN
INTRAMUSCULAR | Status: DC | PRN
  Administered 2012-06-27: 8 mL

## 2012-06-27 MED ORDER — PANTOPRAZOLE SODIUM 40 MG PO TBEC
40.0000 mg | DELAYED_RELEASE_TABLET | Freq: Every day | ORAL | Status: DC
  Administered 2012-06-27 – 2012-06-29 (×3): 40 mg via ORAL
  Filled 2012-06-27 (×3): qty 1

## 2012-06-27 SURGICAL SUPPLY — 54 items
APL SKNCLS STERI-STRIP NONHPOA (GAUZE/BANDAGES/DRESSINGS)
BAG SPEC RTRVL LRG 6X4 10 (ENDOMECHANICALS)
BENZOIN TINCTURE PRP APPL 2/3 (GAUZE/BANDAGES/DRESSINGS) IMPLANT
CHLORAPREP W/TINT 26ML (MISCELLANEOUS) ×3 IMPLANT
CLOTH BEACON ORANGE TIMEOUT ST (SAFETY) ×3 IMPLANT
CORD HIGH FREQUENCY UNIPOLAR (ELECTROSURGICAL) ×3 IMPLANT
CORDS BIPOLAR (ELECTRODE) ×3 IMPLANT
COVER MAYO STAND STRL (DRAPES) ×3 IMPLANT
COVER SURGICAL LIGHT HANDLE (MISCELLANEOUS) ×3 IMPLANT
COVER TIP SHEARS 8 DVNC (MISCELLANEOUS) ×2 IMPLANT
COVER TIP SHEARS 8MM DA VINCI (MISCELLANEOUS) ×1
DECANTER SPIKE VIAL GLASS SM (MISCELLANEOUS) ×3 IMPLANT
DRAPE LG THREE QUARTER DISP (DRAPES) ×6 IMPLANT
DRAPE SURG IRRIG POUCH 19X23 (DRAPES) ×3 IMPLANT
DRAPE TABLE BACK 44X90 PK DISP (DRAPES) ×6 IMPLANT
DRAPE UTILITY XL STRL (DRAPES) ×4 IMPLANT
DRAPE WARM FLUID 44X44 (DRAPE) ×3 IMPLANT
DRSG TEGADERM 6X8 (GAUZE/BANDAGES/DRESSINGS) ×4 IMPLANT
ELECT REM PT RETURN 9FT ADLT (ELECTROSURGICAL) ×3
ELECTRODE REM PT RTRN 9FT ADLT (ELECTROSURGICAL) ×2 IMPLANT
GAUZE VASELINE 3X9 (GAUZE/BANDAGES/DRESSINGS) IMPLANT
GLOVE BIO SURGEON STRL SZ 6.5 (GLOVE) ×12 IMPLANT
GLOVE BIO SURGEON STRL SZ7.5 (GLOVE) ×2 IMPLANT
GLOVE BIO SURGEON STRL SZ8 (GLOVE) ×6 IMPLANT
GLOVE BIOGEL PI IND STRL 7.0 (GLOVE) ×4 IMPLANT
GLOVE BIOGEL PI INDICATOR 7.0 (GLOVE) ×2
GOWN PREVENTION PLUS XLARGE (GOWN DISPOSABLE) ×6 IMPLANT
GOWN STRL NON-REIN LRG LVL3 (GOWN DISPOSABLE) ×6 IMPLANT
HOLDER FOLEY CATH W/STRAP (MISCELLANEOUS) ×3 IMPLANT
KIT ACCESSORY DA VINCI DISP (KITS) ×1
KIT ACCESSORY DVNC DISP (KITS) ×2 IMPLANT
MANIPULATOR UTERINE 4.5 ZUMI (MISCELLANEOUS) ×3 IMPLANT
OCCLUDER COLPOPNEUMO (BALLOONS) ×5 IMPLANT
PACK LAPAROSCOPY W LONG (CUSTOM PROCEDURE TRAY) ×3 IMPLANT
POUCH SPECIMEN RETRIEVAL 10MM (ENDOMECHANICALS) ×4 IMPLANT
SET TUBE IRRIG SUCTION NO TIP (IRRIGATION / IRRIGATOR) ×3 IMPLANT
SHEET LAVH (DRAPES) ×3 IMPLANT
SOLUTION ELECTROLUBE (MISCELLANEOUS) ×3 IMPLANT
SPONGE LAP 18X18 X RAY DECT (DISPOSABLE) IMPLANT
STRIP CLOSURE SKIN 1/2X4 (GAUZE/BANDAGES/DRESSINGS) IMPLANT
SUT VIC AB 0 CT1 27 (SUTURE) ×9
SUT VIC AB 0 CT1 27XBRD ANTBC (SUTURE) ×6 IMPLANT
SUT VIC AB 4-0 PS2 27 (SUTURE) ×6 IMPLANT
SUT VICRYL 0 UR6 27IN ABS (SUTURE) ×3 IMPLANT
SYR 50ML LL SCALE MARK (SYRINGE) ×3 IMPLANT
SYR BULB IRRIGATION 50ML (SYRINGE) IMPLANT
TOWEL OR 17X26 10 PK STRL BLUE (TOWEL DISPOSABLE) ×6 IMPLANT
TOWEL OR NON WOVEN STRL DISP B (DISPOSABLE) ×3 IMPLANT
TRAP SPECIMEN MUCOUS 40CC (MISCELLANEOUS) ×3 IMPLANT
TRAY FOLEY CATH 14FRSI W/METER (CATHETERS) ×3 IMPLANT
TROCAR 12M 150ML BLUNT (TROCAR) ×3 IMPLANT
TROCAR XCEL 12X100 BLDLESS (ENDOMECHANICALS) ×3 IMPLANT
TROCAR XCEL NON-BLD 5MMX100MML (ENDOMECHANICALS) ×6 IMPLANT
WATER STERILE IRR 1500ML POUR (IV SOLUTION) ×6 IMPLANT

## 2012-06-27 NOTE — Anesthesia Preprocedure Evaluation (Addendum)
Anesthesia Evaluation  Patient identified by MRN, date of birth, ID band Patient awake    Reviewed: Allergy & Precautions, H&P , NPO status , Patient's Chart, lab work & pertinent test results  Airway Mallampati: III TM Distance: >3 FB Neck ROM: Full    Dental  (+) Teeth Intact and Dental Advisory Given,    Pulmonary Current Smoker,  breath sounds clear to auscultation  Pulmonary exam normal       Cardiovascular hypertension, Pt. on medications Rhythm:Regular Rate:Normal     Neuro/Psych Anxiety Depression Numbness of lower extremities negative psych ROS   GI/Hepatic negative GI ROS, Neg liver ROS,   Endo/Other  Morbid obesity  Renal/GU negative Renal ROS  negative genitourinary   Musculoskeletal negative musculoskeletal ROS (+)   Abdominal   Peds negative pediatric ROS (+)  Hematology negative hematology ROS (+)   Anesthesia Other Findings   Reproductive/Obstetrics negative OB ROS                          Anesthesia Physical Anesthesia Plan  ASA: II  Anesthesia Plan: General   Post-op Pain Management:    Induction: Intravenous  Airway Management Planned: Oral ETT  Additional Equipment:   Intra-op Plan:   Post-operative Plan: Extubation in OR  Informed Consent: I have reviewed the patients History and Physical, chart, labs and discussed the procedure including the risks, benefits and alternatives for the proposed anesthesia with the patient or authorized representative who has indicated his/her understanding and acceptance.   Dental advisory given  Plan Discussed with: CRNA  Anesthesia Plan Comments:         Anesthesia Quick Evaluation

## 2012-06-27 NOTE — Interval H&P Note (Signed)
History and Physical Interval Note:  06/27/2012 10:39 AM  Kathleen Guzman  has presented today for surgery, with the diagnosis of Symptomatic uterine fibroids  The various methods of treatment have been discussed with the patient and family. After consideration of risks, benefits and other options for treatment, the patient has consented to  Procedure(s): ROBOTIC ASSISTED TOTAL HYSTERECTOMY (N/A) BILATERAL SALPINGECTOMY (Bilateral) as a surgical intervention .  The patient's history has been reviewed, patient examined, no change in status, stable for surgery.  I have reviewed the patient's chart and labs.  Questions were answered to the patient's satisfaction.     JACKSON-MOORE,Berda Shelvin A

## 2012-06-27 NOTE — Anesthesia Procedure Notes (Signed)
Procedure Name: Intubation Date/Time: 06/27/2012 11:23 AM Performed by: Leroy Libman L Patient Re-evaluated:Patient Re-evaluated prior to inductionOxygen Delivery Method: Circle system utilized Preoxygenation: Pre-oxygenation with 100% oxygen Intubation Type: IV induction Ventilation: Mask ventilation without difficulty and Oral airway inserted - appropriate to patient size Laryngoscope Size: Hyacinth Meeker and 2 Grade View: Grade I Tube type: Oral Tube size: 7.5 mm Number of attempts: 1 Airway Equipment and Method: Stylet Placement Confirmation: ETT inserted through vocal cords under direct vision,  positive ETCO2 and breath sounds checked- equal and bilateral Secured at: 21 cm Tube secured with: Tape Dental Injury: Teeth and Oropharynx as per pre-operative assessment

## 2012-06-27 NOTE — Op Note (Signed)
Pre-operative Diagnosis: abnormal uterine bleeding and fibroids  Post-operative Diagnosis: Same  Operation: Robotic-assisted hysterectomy with bilateral salpingectomy  Surgeon: Roseanna Rainbow  Assistant: Coral Ceo, MD  Anesthesia: GET  Urine Output: As per anesthesiology  Findings: There were adhesions involving the anterior cul-de-sac. The uterus was mildly enlarged involvement such as her and her.  Estimated Blood Loss:  less than 100 mL                 Total IV Fluids: Per anesthesiology          Specimens: PATHOLOGY               Complications:  None; patient tolerated the procedure well.         Disposition: PACU - hemodynamically stable.         Condition: Stable    Procedure Details  The patient was seen in the Holding Room. The risks, benefits, complications, treatment options, and expected outcomes were discussed with the patient.  The patient concurred with the proposed plan, giving informed consent.  The site of surgery properly noted/marked. The patient was identified as Kathleen Guzman and the procedure verified as a Robotic-assisted hysterectomy with bilateral salpingectomy. A Time Out was held and the above information confirmed.  After induction of anesthesia, the patient was draped and prepped in the usual sterile manner. Pt was placed in supine position after anesthesia and draped and prepped in the usual sterile manner. The abdominal drape was placed after the CholoraPrep had been allowed to dry for 3 minutes.  Her arms were tucked to her side with all appropriate precautions.  The shoulder blocks were placed in the usual fashion.  The patient was placed in the semi-lithotomy position in [place] stirrups.  The perineum was prepped with Betadine.  Foley catheter was placed.  A sterile speculum was placed in the vagina.  The cervix was grasped with a single-tooth tenaculum and dilated with Shawnie Pons dilators.  The ZUMI uterine manipulator with a medium  colpotomizer ring was placed without difficulty.  A pneum occluder balloon was placed over the manipulator.  A second time-out was performed.  OG tube placement was confirmed and to suction.  Approximately 2 cm below the costal margin, in the midclavicular line the skin was anesthestized with 0.25% Marcaine.  A 5 mm incision was made and using a 5 mm Optiview, a 5 mm trocar was placed under direct vision.  The patient's abdomen was insufflated with CO2 gas.  At this point and all points during the procedure, the patient's intra-abdominal pressure did not exceed 15 mmHg.  A 10-12 camera port was place 24 cm above the pubic symphysis.  Bilateral 8 mm ports were place 10 cm and 15 degrees inferior.  All ports were placed under direct visualization.  The 5 mm port was removed.  The incision was extended to accommodate a 10 mm trocar.  A 10 mm trocar and sleeve were advanced under direct visualization.   The robot was docked in the usual fashion.  The round ligament on the right was transected with monopolar cautery.  The anterior and posterior leaves of the broad ligament were opened.  The ureter was identified.  A window was made between the infundibulopelvic ligament and the ureter.  The right utero-ovarian ligament and proximal fallopian tube were was coagulated with bipolar cautery and transected.  The uterine vessels were skeletonized to the level of the Koh ring. The uterine vessels were coagulated with bipolar cautery and transected.  A C-loop was created in the usual fashion.  A similar procedure was performed on the patient's left side. The round ligament on the left was transected with monopolar cautery.  The anterior and posterior leaves of the broad ligament were opened.  The ureter was identified.  A window was made between the infundibulopelvic ligament and the ureter.  The left utero-ovarian ligament and proximal fallopian tube were was coagulated with bipolar cautery and transected.  The uterine vessels  were skeletonized to the level of the Koh ring. The uterine vessels were coagulated with bipolar cautery and transected.  A C-loop was created in the usual fashion.   The bladder flap was completed.  At this time, the pneum occluder balloon was insufflated and a colpotomy was performed.  The uterus and cervix were the vagina.   The uterus was morcellated in the vagina to facilitate removal. The left fallopian tube was grasped. [employer] does [employer] things was coagulated with bipolar cautery and transected. The tube was removed from the abdomen. The right fallopian tube was manipulated in similar fashion. All pedicles were inspected under low intraabdominal pressures and noted to be hemostatic.  The vagina cuff was closed using 0-Vicryl on a CT-1 needle in a running fashion.  The abdomen and pelvis were copiously irrigated.  The needle was removed without difficulty.  The instruments were then removed under direct visualization and the robotic ports removed.  The robot was undocked.  Deep, subcutaneous, figure-of-eight 0-Vicryl sutures on a UR-6 needle were placed in the 10-12 mm supraumbilical and infracostal incisions.  All skin incisions were closed in a subcuticular fashion using 3-0 Monocryl.  Steri-strips and Benzoin were applied.  The vagina was swabbed with minimal bleeding noted.   All instrument and needle counts were correct x  2.

## 2012-06-27 NOTE — Transfer of Care (Signed)
Immediate Anesthesia Transfer of Care Note  Patient: Kathleen Guzman  Procedure(s) Performed: Procedure(s): ROBOTIC ASSISTED TOTAL HYSTERECTOMY (N/A) BILATERAL SALPINGECTOMY (Bilateral)  Patient Location: PACU  Anesthesia Type:General  Level of Consciousness: awake and oriented  Airway & Oxygen Therapy: Patient Spontanous Breathing and Patient connected to face mask oxygen  Post-op Assessment: Report given to PACU RN and Post -op Vital signs reviewed and stable  Post vital signs: Reviewed and stable  Complications: No apparent anesthesia complications

## 2012-06-27 NOTE — Preoperative (Signed)
Beta Blockers   Reason not to administer Beta Blockers:Not Applicable 

## 2012-06-27 NOTE — Anesthesia Postprocedure Evaluation (Signed)
Anesthesia Post Note  Patient: Kathleen Guzman  Procedure(s) Performed: Procedure(s) (LRB): ROBOTIC ASSISTED TOTAL HYSTERECTOMY (N/A) BILATERAL SALPINGECTOMY (Bilateral)  Anesthesia type: General  Patient location: PACU  Post pain: Pain level controlled  Post assessment: Post-op Vital signs reviewed  Last Vitals:  Filed Vitals:   06/27/12 1730  BP: 132/94  Pulse: 54  Temp: 36.5 C  Resp: 18    Post vital signs: Reviewed  Level of consciousness: sedated  Complications: No apparent anesthesia complications

## 2012-06-28 LAB — COMPREHENSIVE METABOLIC PANEL
ALT: 15 U/L (ref 0–35)
BUN: 7 mg/dL (ref 6–23)
CO2: 29 mEq/L (ref 19–32)
Calcium: 8.6 mg/dL (ref 8.4–10.5)
Creatinine, Ser: 0.84 mg/dL (ref 0.50–1.10)
GFR calc Af Amer: >90 mL/min (ref >90)
GFR calc non Af Amer: 84 mL/min — ABNORMAL LOW (ref >90)
Glucose, Bld: 151 mg/dL — ABNORMAL HIGH (ref 70–99)
Sodium: 138 mEq/L (ref 135–145)
Total Protein: 6.3 g/dL (ref 6.0–8.3)

## 2012-06-28 LAB — CBC
HCT: 32.3 % — ABNORMAL LOW (ref 36.0–46.0)
Hemoglobin: 10.5 g/dL — ABNORMAL LOW (ref 12.0–15.0)
MCH: 26.3 pg (ref 26.0–34.0)
MCHC: 32.5 g/dL (ref 30.0–36.0)
MCV: 80.8 fL (ref 78.0–100.0)
RBC: 4 MIL/uL (ref 3.87–5.11)

## 2012-06-28 NOTE — Progress Notes (Signed)
Subjective: Patient reports nausea and incisional pain.    Objective: I have reviewed patient's vital signs, intake and output, medications and labs.  General: alert and no distress GI: normal findings: soft, non-tender Extremities: extremities normal, atraumatic, no cyanosis or edema Vaginal Bleeding: none   Assessment/Plan: Not tolerating regular diet yet.  Encourage ambulation.   LOS: 1 day    HARPER,CHARLES A 06/28/2012, 4:57 PM

## 2012-06-29 MED ORDER — OXYCODONE-ACETAMINOPHEN 5-325 MG PO TABS: 1.0000 | ORAL_TABLET | ORAL | Status: AC | PRN

## 2012-06-29 MED ORDER — IBUPROFEN 800 MG PO TABS: 800.0000 mg | ORAL_TABLET | Freq: Four times a day (QID) | ORAL | Status: AC | PRN

## 2012-06-29 MED ORDER — FUSION PLUS PO CAPS: 1.0000 | ORAL_CAPSULE | Freq: Every day | ORAL | Status: AC

## 2012-06-29 NOTE — Progress Notes (Signed)
While I was going over pt's discharge percocet pain med she reported that she takes 10/325 not the 5/325 that was prescribed. She asked me to call MD and ask if he could prescribed some stronger pain med.  I called MD and he was not willing to increase her dosage.  He told me to inform pt that she probably won't have as much pain postop as preop.  Pt. Was told and voiced understanding.   Informed pt to call office if her pain was uncontrolled.

## 2012-06-29 NOTE — Discharge Summary (Signed)
Physician Discharge Summary  Patient ID: Kathleen Guzman MRN: [id number] DOB/AGE: May 07, 1970 42 y.o.  Admit date: 06/27/2012 Discharge date: 06/29/2012  Admission Diagnoses:  Symptomatic uterine fibroids  Discharge Diagnoses:  Same Active Problems:   * No active hospital problems. *   Discharged Condition: good  Hospital Course: Underwent Robotic assisted TLH and Bilateral Salpingectomy.  There were no complications.  Discharged home in good condition.  Consults: None  Significant Diagnostic Studies: labs: CBC, CMET  Treatments: IV hydration and analgesia: Percocet  Discharge Exam: Blood pressure 138/87, pulse 70, temperature 98.5 F (36.9 C), temperature source Oral, resp. rate 16, height 5' 3.5" (1.613 m), weight 278 lb 8 oz (126.327 kg), last menstrual period 05/29/2012, SpO2 98.00%. General appearance: alert and no distress GI: normal findings: soft, non-tender and Incision clean, dry and intact. Extremities: extremities normal, atraumatic, no cyanosis or edema  Disposition:   Discharge Orders   Future Orders Complete By Expires     (HEART FAILURE PATIENTS) Call MD:  Anytime you have any of the following symptoms: 1) 3 pound weight gain in 24 hours or 5 pounds in 1 week 2) shortness of breath, with or without a dry hacking cough 3) swelling in the hands, feet or stomach 4) if you have to sleep on extra pillows at night in order to breathe.  As directed     Call MD for:  difficulty breathing, headache or visual disturbances  As directed     Call MD for:  extreme fatigue  As directed     Call MD for:  hives  As directed     Call MD for:  persistant dizziness or light-headedness  As directed     Call MD for:  persistant nausea and vomiting  As directed     Call MD for:  redness, tenderness, or signs of infection (pain, swelling, redness, odor or green/yellow discharge around incision site)  As directed     Call MD for:  severe uncontrolled pain  As directed     Call MD for:   temperature >100.4  As directed     Call MD for:  As directed     Diet - low sodium heart healthy  As directed     Discharge instructions  As directed     Comments:      Routine    Discharge wound care:  As directed     Comments:      Keep incisions clean and dry.    Driving Restrictions  As directed     Comments:      No driving for 2 weeks.    Increase activity slowly  As directed     Lifting restrictions  As directed     Comments:      No lifting > 20 lbs.    Sexual Activity Restrictions  As directed     Comments:      No sex for 6 weeks.        Medication List    TAKE these medications       cyclobenzaprine 10 MG tablet  Commonly known as:  FLEXERIL  Take 10 mg by mouth at bedtime.     ferrous fumarate 325 (106 FE) MG Tabs  Commonly known as:  HEMOCYTE - 106 mg FE  Take 1 tablet by mouth daily.     FUSION PLUS Caps  Take 1 capsule by mouth daily before breakfast.     ibuprofen 800 MG tablet  Commonly known as:  ADVIL,MOTRIN  Take 1 tablet (800 mg total) by mouth every 6 (six) hours as needed for pain (mild pain).     lisinopril 10 MG tablet  Commonly known as:  PRINIVIL,ZESTRIL  Take 10 mg by mouth every morning.     loratadine 10 MG tablet  Commonly known as:  CLARITIN  Take 10 mg by mouth daily.     oxyCODONE 5 MG immediate release tablet  Commonly known as:  Oxy IR/ROXICODONE  Take 5 mg by mouth every 6 (six) hours as needed. For pain     oxyCODONE-acetaminophen 5-325 MG per tablet  Commonly known as:  PERCOCET/ROXICET  Take 1-2 tablets by mouth every 4 (four) hours as needed.           Follow-up Information   Follow up with Antionette Char A, MD. Schedule an appointment as soon as possible for a visit in 2 weeks.   Contact information:   [address] [place] [address] [phone]       Signed: Brock Bad 06/29/2012, 9:08 AM

## 2012-06-29 NOTE — Progress Notes (Signed)
2 Days Post-Op Procedure(s) (LRB): ROBOTIC ASSISTED TOTAL HYSTERECTOMY (N/A) BILATERAL SALPINGECTOMY (Bilateral)  Subjective: Patient reports tolerating PO, + flatus and no problems voiding.    Objective: I have reviewed patient's vital signs, intake and output, medications and labs.  General: alert and no distress GI: normal findings: soft, non-tender Extremities: extremities normal, atraumatic, no cyanosis or edema Vaginal Bleeding: none  Assessment: s/p Procedure(s): ROBOTIC ASSISTED TOTAL HYSTERECTOMY (N/A) BILATERAL SALPINGECTOMY (Bilateral): stable, progressing well and tolerating diet  Plan: Discharge home  LOS: 2 days    Feven Alderfer A 06/29/2012, 8:57 AM

## 2012-06-30 ENCOUNTER — Encounter (HOSPITAL_COMMUNITY): Payer: Self-pay | Admitting: Obstetrics & Gynecology

## 2012-07-07 ENCOUNTER — Telehealth: Payer: Self-pay | Admitting: *Deleted

## 2012-07-07 NOTE — Telephone Encounter (Signed)
Pt left message on machine requesting refill on Oxycodone. Last writtent 06-09-12 for Oxycodone 10 mg #40, i p.o. q4-6 hours prn.

## 2012-07-08 NOTE — Telephone Encounter (Signed)
Pt requesting refill on Oxycodone 5/325mg . Pt reports hysterectomy 06/27/12. Pt c/o Ibuprofen 800 mg does not help with pain. Pt post op scheduled for 08/13/12. Per Dr. Clearance Coots offer pt appointment this week for evaluation of incision and pain. Offered pt appointment this week and due to transportation issues pt can not be seen until next week. Pt scheduled 07/15/12 at 11am for post op and pt is aware. Pt is aware prescription will not be given until appointment.

## 2012-07-15 ENCOUNTER — Ambulatory Visit: Payer: Self-pay | Admitting: Obstetrics

## 2012-07-16 ENCOUNTER — Encounter: Payer: Self-pay | Admitting: Obstetrics

## 2012-07-24 ENCOUNTER — Encounter: Payer: Medicaid Other | Admitting: Obstetrics & Gynecology

## 2012-08-13 ENCOUNTER — Encounter: Payer: Self-pay | Admitting: Obstetrics & Gynecology

## 2013-03-16 ENCOUNTER — Other Ambulatory Visit: Payer: Self-pay

## 2013-03-16 DIAGNOSIS — Z1231 Encounter for screening mammogram for malignant neoplasm of breast: Secondary | ICD-10-CM

## 2013-04-29 ENCOUNTER — Ambulatory Visit: Payer: Medicaid Other

## 2013-05-06 ENCOUNTER — Ambulatory Visit
Admission: RE | Admit: 2013-05-06 | Discharge: 2013-05-06 | Disposition: A | Discharge status: Home / Self Care | Payer: Medicaid Other | Source: Ambulatory Visit

## 2013-05-06 DIAGNOSIS — Z1231 Encounter for screening mammogram for malignant neoplasm of breast: Secondary | ICD-10-CM

## 2013-07-03 ENCOUNTER — Encounter: Payer: Self-pay | Admitting: Obstetrics

## 2014-04-06 ENCOUNTER — Other Ambulatory Visit: Payer: Self-pay

## 2014-04-06 DIAGNOSIS — Z1231 Encounter for screening mammogram for malignant neoplasm of breast: Secondary | ICD-10-CM

## 2014-05-07 ENCOUNTER — Encounter (INDEPENDENT_AMBULATORY_CARE_PROVIDER_SITE_OTHER): Payer: Self-pay

## 2014-05-07 ENCOUNTER — Ambulatory Visit
Admission: RE | Admit: 2014-05-07 | Discharge: 2014-05-07 | Disposition: A | Discharge status: Home / Self Care | Payer: Medicaid Other | Source: Ambulatory Visit

## 2014-05-07 DIAGNOSIS — Z1231 Encounter for screening mammogram for malignant neoplasm of breast: Secondary | ICD-10-CM

## 2015-04-12 ENCOUNTER — Encounter (HOSPITAL_COMMUNITY): Payer: Self-pay | Admitting: Emergency Medicine

## 2015-04-12 ENCOUNTER — Emergency Department (HOSPITAL_COMMUNITY)
Admission: EM | Admit: 2015-04-12 | Discharge: 2015-04-12 | Disposition: A | Discharge status: Home / Self Care | Payer: Medicaid Other | Attending: Emergency Medicine | Admitting: Emergency Medicine

## 2015-04-12 DIAGNOSIS — B9789 Other viral agents as the cause of diseases classified elsewhere: Secondary | ICD-10-CM

## 2015-04-12 DIAGNOSIS — Z79899 Other long term (current) drug therapy: Secondary | ICD-10-CM | POA: Insufficient documentation

## 2015-04-12 DIAGNOSIS — M1990 Unspecified osteoarthritis, unspecified site: Secondary | ICD-10-CM | POA: Insufficient documentation

## 2015-04-12 DIAGNOSIS — Z8659 Personal history of other mental and behavioral disorders: Secondary | ICD-10-CM | POA: Insufficient documentation

## 2015-04-12 DIAGNOSIS — F17200 Nicotine dependence, unspecified, uncomplicated: Secondary | ICD-10-CM | POA: Insufficient documentation

## 2015-04-12 DIAGNOSIS — J069 Acute upper respiratory infection, unspecified: Secondary | ICD-10-CM | POA: Diagnosis not present

## 2015-04-12 DIAGNOSIS — J028 Acute pharyngitis due to other specified organisms: Secondary | ICD-10-CM

## 2015-04-12 DIAGNOSIS — I10 Essential (primary) hypertension: Secondary | ICD-10-CM | POA: Diagnosis not present

## 2015-04-12 DIAGNOSIS — J029 Acute pharyngitis, unspecified: Secondary | ICD-10-CM | POA: Diagnosis present

## 2015-04-12 LAB — RAPID STREP SCREEN (MED CTR MEBANE ONLY): STREPTOCOCCUS, GROUP A SCREEN (DIRECT): NEGATIVE

## 2015-04-12 MED ORDER — BENZONATATE 100 MG PO CAPS: 100.0000 mg | ORAL_CAPSULE | Freq: Three times a day (TID) | ORAL | Status: DC | PRN

## 2015-04-12 MED ORDER — ACETAMINOPHEN 325 MG PO TABS
650.0000 mg | ORAL_TABLET | Freq: Once | ORAL | Status: AC
  Administered 2015-04-12: 650 mg via ORAL
  Filled 2015-04-12: qty 2

## 2015-04-12 MED ORDER — NAPROXEN 250 MG PO TABS: 250.0000 mg | ORAL_TABLET | Freq: Two times a day (BID) | ORAL | Status: DC

## 2015-04-12 MED ORDER — CETIRIZINE HCL 10 MG PO TABS: 10.0000 mg | ORAL_TABLET | Freq: Every day | ORAL | Status: AC

## 2015-04-12 MED ORDER — FLUTICASONE PROPIONATE 50 MCG/ACT NA SUSP: 2.0000 | Freq: Every day | NASAL | Status: AC

## 2015-04-12 NOTE — Discharge Instructions (Signed)
Sore Throat A sore throat is pain, burning, irritation, or scratchiness of the throat. There is often pain or tenderness when swallowing or talking. A sore throat may be accompanied by other symptoms, such as coughing, sneezing, fever, and swollen neck glands. A sore throat is often the first sign of another sickness, such as a cold, flu, strep throat, or mononucleosis (commonly known as mono). Most sore throats go away without medical treatment. CAUSES  The most common causes of a sore throat include:  A viral infection, such as a cold, flu, or mono.  A bacterial infection, such as strep throat, tonsillitis, or whooping cough.  Seasonal allergies.  Dryness in the air.  Irritants, such as smoke or pollution.  Gastroesophageal reflux disease (GERD). HOME CARE INSTRUCTIONS   Only take over-the-counter medicines as directed by your caregiver.  Drink enough fluids to keep your urine clear or pale yellow.  Rest as needed.  Try using throat sprays, lozenges, or sucking on hard candy to ease any pain (if older than 4 years or as directed).  Sip warm liquids, such as broth, herbal tea, or warm water with honey to relieve pain temporarily. You may also eat or drink cold or frozen liquids such as frozen ice pops.  Gargle with salt water (mix 1 tsp salt with 8 oz of water).  Do not smoke and avoid secondhand smoke.  Put a cool-mist humidifier in your bedroom at night to moisten the air. You can also turn on a hot shower and sit in the bathroom with the door closed for 5-10 minutes. SEEK IMMEDIATE MEDICAL CARE IF:  You have difficulty breathing.  You are unable to swallow fluids, soft foods, or your saliva.  You have increased swelling in the throat.  Your sore throat does not get better in 7 days.  You have nausea and vomiting.  You have a fever or persistent symptoms for more than 2-3 days.  You have a fever and your symptoms suddenly get worse. MAKE SURE YOU:   Understand  these instructions.  Will watch your condition.  Will get help right away if you are not doing well or get worse.   This information is not intended to replace advice given to you by your health care provider. Make sure you discuss any questions you have with your health care provider.   Document Released: 04/19/2004 Document Revised: 04/02/2014 Document Reviewed: 11/18/2011 Elsevier Interactive Patient Education 2016 [employer] Upper Respiratory Infection, Adult Most upper respiratory infections (URIs) are a viral infection of the air passages leading to the lungs. A URI affects the nose, throat, and upper air passages. The most common type of URI is nasopharyngitis and is typically referred to as "the common cold." URIs run their course and usually go away on their own. Most of the time, a URI does not require medical attention, but sometimes a bacterial infection in the upper airways can follow a viral infection. This is called a secondary infection. Sinus and middle ear infections are common types of secondary upper respiratory infections. Bacterial pneumonia can also complicate a URI. A URI can worsen asthma and chronic obstructive pulmonary disease (COPD). Sometimes, these complications can require emergency medical care and may be life threatening.  CAUSES Almost all URIs are caused by viruses. A virus is a type of germ and can spread from one person to another.  RISKS FACTORS You may be at risk for a URI if:   You smoke.   You have chronic heart or  lung disease.  You have a weakened defense (immune) system.   You are very young or very old.   You have nasal allergies or asthma.  You work in crowded or poorly ventilated areas.  You work in health care facilities or schools. SIGNS AND SYMPTOMS  Symptoms typically develop 2-3 days after you come in contact with a cold virus. Most viral URIs last 7-10 days. However, viral URIs from the influenza virus (flu virus) can last  14-18 days and are typically more severe. Symptoms may include:   Runny or stuffy (congested) nose.   Sneezing.   Cough.   Sore throat.   Headache.   Fatigue.   Fever.   Loss of appetite.   Pain in your forehead, behind your eyes, and over your cheekbones (sinus pain).  Muscle aches.  DIAGNOSIS  Your health care provider may diagnose a URI by:  Physical exam.  Tests to check that your symptoms are not due to another condition such as:  Strep throat.  Sinusitis.  Pneumonia.  Asthma. TREATMENT  A URI goes away on its own with time. It cannot be cured with medicines, but medicines may be prescribed or recommended to relieve symptoms. Medicines may help:  Reduce your fever.  Reduce your cough.  Relieve nasal congestion. HOME CARE INSTRUCTIONS   Take medicines only as directed by your health care provider.   Gargle warm saltwater or take cough drops to comfort your throat as directed by your health care provider.  Use a warm mist humidifier or inhale steam from a shower to increase air moisture. This may make it easier to breathe.  Drink enough fluid to keep your urine clear or pale yellow.   Eat soups and other clear broths and maintain good nutrition.   Rest as needed.   Return to work when your temperature has returned to normal or as your health care provider advises. You may need to stay home longer to avoid infecting others. You can also use a face mask and careful hand washing to prevent spread of the virus.  Increase the usage of your inhaler if you have asthma.   Do not use any tobacco products, including cigarettes, chewing tobacco, or electronic cigarettes. If you need help quitting, ask your health care provider. PREVENTION  The best way to protect yourself from getting a cold is to practice good hygiene.   Avoid oral or hand contact with people with cold symptoms.   Wash your hands often if contact occurs.  There is no clear  evidence that vitamin C, vitamin E, echinacea, or exercise reduces the chance of developing a cold. However, it is always recommended to get plenty of rest, exercise, and practice good nutrition.  SEEK MEDICAL CARE IF:   You are getting worse rather than better.   Your symptoms are not controlled by medicine.   You have chills.  You have worsening shortness of breath.  You have brown or red mucus.  You have yellow or brown nasal discharge.  You have pain in your face, especially when you bend forward.  You have a fever.  You have swollen neck glands.  You have pain while swallowing.  You have white areas in the back of your throat. SEEK IMMEDIATE MEDICAL CARE IF:   You have severe or persistent:  Headache.  Ear pain.  Sinus pain.  Chest pain.  You have chronic lung disease and any of the following:  Wheezing.  Prolonged cough.  Coughing up blood.  A change in your usual mucus.  You have a stiff neck.  You have changes in your:  Vision.  Hearing.  Thinking.  Mood. MAKE SURE YOU:   Understand these instructions.  Will watch your condition.  Will get help right away if you are not doing well or get worse.   This information is not intended to replace advice given to you by your health care provider. Make sure you discuss any questions you have with your health care provider.   Document Released: 09/05/2000 Document Revised: 07/27/2014 Document Reviewed: 06/17/2013 Elsevier Interactive Patient Education [employer].

## 2015-04-12 NOTE — ED Provider Notes (Signed)
CSN: [id number]     Arrival date & time 04/12/15  1023 History  By signing my name below, I, Evelene Croon, attest that this documentation has been prepared under the direction and in the presence of non-physician practitioner, Waynetta Pean, PA-C. Electronically Signed: Evelene Croon, Scribe. 04/12/2015. 11:44 AM.    Chief Complaint  Patient presents with  . Sore Throat    The history is provided by the patient. No language interpreter was used.     HPI Comments:  Kathleen Guzman is a 45 y.o. female who presents to the Emergency Department complaining of sore throat x 3 days. She describes her throat as "dry and scratchy". Pt is able to swallow secretions but notes pain is exacerbated when she does so. She reports associated cough, congestion and postnasal drip. She denies wheezing , SOB, and fever. She has taken nothing for treatment today. No alleviating factors noted.    Past Medical History  Diagnosis Date  . Hypertension   . Numbness in both legs   . Arthritis   . Anxiety   . Depression   . Degenerative disc disease, lumbar    Past Surgical History  Procedure Laterality Date  . Cesarian     . Cesarean section    . Dilation and curettage of uterus    . Robotic assisted total hysterectomy N/A 06/27/2012    Procedure: ROBOTIC ASSISTED TOTAL HYSTERECTOMY;  Surgeon: Lahoma Crocker, MD;  Location: [facility];  Service: Gynecology;  Laterality: N/A;  . Bilateral salpingectomy Bilateral 06/27/2012    Procedure: BILATERAL SALPINGECTOMY;  Surgeon: Lahoma Crocker, MD;  Location: [facility];  Service: Gynecology;  Laterality: Bilateral;   History reviewed. No pertinent family history. Social History  Substance Use Topics  . Smoking status: Current Every Day Smoker -- 0.50 packs/day  . Smokeless tobacco: None  . Alcohol Use: Yes     Comment: occasional   OB History    No data available     Review of Systems  Constitutional: Negative for fever and chills.  HENT: Positive for  congestion, postnasal drip, rhinorrhea, sneezing and sore throat. Negative for dental problem, ear discharge, ear pain and trouble swallowing.   Eyes: Negative for pain, redness and itching.  Respiratory: Positive for cough. Negative for chest tightness, shortness of breath and wheezing.   Cardiovascular: Negative for chest pain.  Gastrointestinal: Negative for vomiting, abdominal pain and diarrhea.  Skin: Negative for rash and wound.  Neurological: Negative for light-headedness.     Allergies  Review of patient's allergies indicates no known allergies.  Home Medications   Prior to Admission medications   Medication Sig Start Date End Date Taking? Authorizing Provider  benzonatate (TESSALON) 100 MG capsule Take 1 capsule (100 mg total) by mouth 3 (three) times daily as needed for cough. 04/12/15   Waynetta Pean, PA-C  cetirizine (ZYRTEC ALLERGY) 10 MG tablet Take 1 tablet (10 mg total) by mouth daily. 04/12/15   Waynetta Pean, PA-C  cyclobenzaprine (FLEXERIL) 10 MG tablet Take 10 mg by mouth at bedtime.    Historical Provider, MD  ferrous fumarate (HEMOCYTE - 106 MG FE) 325 (106 FE) MG TABS Take 1 tablet by mouth daily.    Historical Provider, MD  fluticasone (FLONASE) 50 MCG/ACT nasal spray Place 2 sprays into both nostrils daily. 04/12/15   Waynetta Pean, PA-C  ibuprofen (ADVIL,MOTRIN) 800 MG tablet Take 1 tablet (800 mg total) by mouth every 6 (six) hours as needed for pain (mild pain). 06/29/12   Clenton Pare  Jodi Mourning, MD  Iron-FA-B Cmp-C-Biot-Probiotic (FUSION PLUS) CAPS Take 1 capsule by mouth daily before breakfast. 06/29/12   Shelly Bombard, MD  lisinopril (PRINIVIL,ZESTRIL) 10 MG tablet Take 10 mg by mouth every morning.    Historical Provider, MD  loratadine (CLARITIN) 10 MG tablet Take 10 mg by mouth daily.    Historical Provider, MD  naproxen (NAPROSYN) 250 MG tablet Take 1 tablet (250 mg total) by mouth 2 (two) times daily with a meal. 04/12/15   Waynetta Pean, PA-C  oxyCODONE (OXY  IR/ROXICODONE) 5 MG immediate release tablet Take 5 mg by mouth every 6 (six) hours as needed. For pain    Historical Provider, MD  oxyCODONE-acetaminophen (PERCOCET/ROXICET) 5-325 MG per tablet Take 1-2 tablets by mouth every 4 (four) hours as needed. 06/29/12   Shelly Bombard, MD   BP 138/96 mmHg  Pulse 83  Temp(Src) 98.8 F (37.1 C) (Oral)  Resp 18  SpO2 95%  LMP 05/29/2012 Physical Exam  Constitutional: She is oriented to person, place, and time. She appears well-developed and well-nourished. No distress.  Nontoxic appearing.  HENT:  Head: Normocephalic and atraumatic.  Right Ear: External ear normal.  Left Ear: External ear normal.  Mouth/Throat: Uvula is midline. No trismus in the jaw. No uvula swelling. Posterior oropharyngeal erythema present. No oropharyngeal exudate or posterior oropharyngeal edema.  Mild postoropharyngeal erythema No tonsillar hypertrophy or exudates.  No edema/trismus Uvula is midline  Boggy nasal turbinates bilaterally.   Eyes: Conjunctivae are normal. Pupils are equal, round, and reactive to light. Right eye exhibits no discharge. Left eye exhibits no discharge.  Neck: Normal range of motion. Neck supple. No JVD present. No tracheal deviation present.  Good range of motion of her neck.  Cardiovascular: Normal rate, regular rhythm, normal heart sounds and intact distal pulses.  Exam reveals no gallop and no friction rub.   No murmur heard. Pulmonary/Chest: Effort normal and breath sounds normal. No respiratory distress. She has no wheezes. She has no rales.  Lungs are clear to auscultation bilaterally.  Abdominal: Soft. There is no tenderness. There is no guarding.  Lymphadenopathy:    She has no cervical adenopathy.  Neurological: She is alert and oriented to person, place, and time. Coordination normal.  Skin: Skin is warm and dry. No rash noted. She is not diaphoretic. No erythema. No pallor.  Psychiatric: She has a normal mood and affect. Her  behavior is normal.  Nursing note and vitals reviewed.   ED Course  Procedures   DIAGNOSTIC STUDIES:  Oxygen Saturation is 95% on RA, normal by my interpretation.    COORDINATION OF CARE:  11:42 AM Will order rapid strep. Discussed treatment plan with pt at bedside and pt agreed to plan.  Labs Review Labs Reviewed  RAPID STREP SCREEN (NOT AT [facility])  CULTURE, GROUP A STREP [facility])    Imaging Review No results found. I have personally reviewed and evaluated these lab results as part of my medical decision-making.   EKG Interpretation None      Filed Vitals:   04/12/15 1053  BP: 138/96  Pulse: 83  Temp: 98.8 F (37.1 C)  TempSrc: Oral  Resp: 18  SpO2: 95%     MDM   Meds given in ED:  Medications  acetaminophen (TYLENOL) tablet 650 mg (not administered)    New Prescriptions   BENZONATATE (TESSALON) 100 MG CAPSULE    Take 1 capsule (100 mg total) by mouth 3 (three) times daily as needed for cough.  CETIRIZINE (ZYRTEC ALLERGY) 10 MG TABLET    Take 1 tablet (10 mg total) by mouth daily.   FLUTICASONE (FLONASE) 50 MCG/ACT NASAL SPRAY    Place 2 sprays into both nostrils daily.   NAPROXEN (NAPROSYN) 250 MG TABLET    Take 1 tablet (250 mg total) by mouth 2 (two) times daily with a meal.    Final diagnoses:  Sore throat (viral)  URI (upper respiratory infection)   Patient presents with 3 days of sore throat, nasal congestion, postnasal drip and cough. No fevers. On exam the patient is afebrile nontoxic appearing. She has boggy nasal turbinates bilaterally. Mild posterior oropharyngeal erythema without edema. No tonsillar hypertrophy or exudates. Rapid strep is negative. Patient with viral sore throat and upper respiratory infection. Will start the patient on Flonase, Zyrtec, Tessalon Perles and naproxen. I encouraged her to push oral fluids. I advised the patient to follow-up with their primary care provider this week. I advised the patient to return to the emergency  department with new or worsening symptoms or new concerns. The patient verbalized understanding and agreement with plan.    I personally performed the services described in this documentation, which was scribed in my presence. The recorded information has been reviewed and is accurate.      Waynetta Pean, PA-C 04/12/15 [place], MD 04/12/15 [phone]

## 2015-04-12 NOTE — ED Notes (Signed)
Pt sts sore throat x 3 days with cough and congestion

## 2015-04-14 LAB — CULTURE, GROUP A STREP (THRC)

## 2015-06-16 ENCOUNTER — Other Ambulatory Visit: Payer: Self-pay

## 2015-06-16 DIAGNOSIS — Z1231 Encounter for screening mammogram for malignant neoplasm of breast: Secondary | ICD-10-CM

## 2015-07-11 ENCOUNTER — Ambulatory Visit: Payer: Medicaid Other

## 2016-06-19 ENCOUNTER — Other Ambulatory Visit: Payer: Self-pay | Admitting: Internal Medicine

## 2016-06-19 DIAGNOSIS — Z1231 Encounter for screening mammogram for malignant neoplasm of breast: Secondary | ICD-10-CM

## 2016-06-28 ENCOUNTER — Inpatient Hospital Stay (HOSPITAL_COMMUNITY)
Admission: EM | Admit: 2016-06-28 | Discharge: 2016-07-04 | DRG: 189 | Disposition: A | Discharge status: Home / Self Care | Payer: Medicaid Other | Attending: Family Medicine | Admitting: Family Medicine

## 2016-06-28 ENCOUNTER — Emergency Department (HOSPITAL_COMMUNITY): Payer: Medicaid Other

## 2016-06-28 ENCOUNTER — Encounter (HOSPITAL_COMMUNITY): Payer: Self-pay | Admitting: Emergency Medicine

## 2016-06-28 DIAGNOSIS — F319 Bipolar disorder, unspecified: Secondary | ICD-10-CM | POA: Diagnosis present

## 2016-06-28 DIAGNOSIS — Z720 Tobacco use: Secondary | ICD-10-CM | POA: Diagnosis not present

## 2016-06-28 DIAGNOSIS — Z90710 Acquired absence of both cervix and uterus: Secondary | ICD-10-CM

## 2016-06-28 DIAGNOSIS — R079 Chest pain, unspecified: Secondary | ICD-10-CM | POA: Diagnosis not present

## 2016-06-28 DIAGNOSIS — I10 Essential (primary) hypertension: Secondary | ICD-10-CM | POA: Diagnosis not present

## 2016-06-28 DIAGNOSIS — F329 Major depressive disorder, single episode, unspecified: Secondary | ICD-10-CM | POA: Diagnosis present

## 2016-06-28 DIAGNOSIS — F419 Anxiety disorder, unspecified: Secondary | ICD-10-CM | POA: Diagnosis present

## 2016-06-28 DIAGNOSIS — Z888 Allergy status to other drugs, medicaments and biological substances status: Secondary | ICD-10-CM

## 2016-06-28 DIAGNOSIS — F17210 Nicotine dependence, cigarettes, uncomplicated: Secondary | ICD-10-CM | POA: Diagnosis present

## 2016-06-28 DIAGNOSIS — Z6841 Body Mass Index (BMI) 40.0 and over, adult: Secondary | ICD-10-CM

## 2016-06-28 DIAGNOSIS — J9601 Acute respiratory failure with hypoxia: Principal | ICD-10-CM | POA: Diagnosis present

## 2016-06-28 DIAGNOSIS — A419 Sepsis, unspecified organism: Secondary | ICD-10-CM | POA: Diagnosis present

## 2016-06-28 DIAGNOSIS — J40 Bronchitis, not specified as acute or chronic: Secondary | ICD-10-CM | POA: Diagnosis not present

## 2016-06-28 DIAGNOSIS — R000 Tachycardia, unspecified: Secondary | ICD-10-CM | POA: Diagnosis present

## 2016-06-28 DIAGNOSIS — J441 Chronic obstructive pulmonary disease with (acute) exacerbation: Secondary | ICD-10-CM | POA: Diagnosis present

## 2016-06-28 DIAGNOSIS — J440 Chronic obstructive pulmonary disease with acute lower respiratory infection: Secondary | ICD-10-CM | POA: Diagnosis present

## 2016-06-28 DIAGNOSIS — J209 Acute bronchitis, unspecified: Secondary | ICD-10-CM | POA: Diagnosis present

## 2016-06-28 DIAGNOSIS — Z8249 Family history of ischemic heart disease and other diseases of the circulatory system: Secondary | ICD-10-CM

## 2016-06-28 DIAGNOSIS — Z800 Family history of malignant neoplasm of digestive organs: Secondary | ICD-10-CM

## 2016-06-28 DIAGNOSIS — Z716 Tobacco abuse counseling: Secondary | ICD-10-CM

## 2016-06-28 DIAGNOSIS — Z7951 Long term (current) use of inhaled steroids: Secondary | ICD-10-CM

## 2016-06-28 DIAGNOSIS — M1990 Unspecified osteoarthritis, unspecified site: Secondary | ICD-10-CM | POA: Diagnosis present

## 2016-06-28 DIAGNOSIS — Z79899 Other long term (current) drug therapy: Secondary | ICD-10-CM

## 2016-06-28 DIAGNOSIS — Z9889 Other specified postprocedural states: Secondary | ICD-10-CM

## 2016-06-28 HISTORY — DX: Bronchitis, not specified as acute or chronic: J40

## 2016-06-28 LAB — CBC WITH DIFFERENTIAL/PLATELET
Basophils Absolute: 0 10*3/uL (ref 0.0–0.1)
Basophils Relative: 0 %
Eosinophils Absolute: 0.3 10*3/uL (ref 0.0–0.7)
Eosinophils Relative: 4 %
HEMATOCRIT: 39.4 % (ref 36.0–46.0)
Hemoglobin: 12.8 g/dL (ref 12.0–15.0)
LYMPHS PCT: 19 %
Lymphs Abs: 1.1 10*3/uL (ref 0.7–4.0)
MCH: 28.8 pg (ref 26.0–34.0)
MCHC: 32.5 g/dL (ref 30.0–36.0)
MCV: 88.7 fL (ref 78.0–100.0)
MONO ABS: 0.5 10*3/uL (ref 0.1–1.0)
MONOS PCT: 8 %
NEUTROS ABS: 4 10*3/uL (ref 1.7–7.7)
Neutrophils Relative %: 69 %
Platelets: 370 10*3/uL (ref 150–400)
RBC: 4.44 MIL/uL (ref 3.87–5.11)
RDW: 13.9 % (ref 11.5–15.5)
WBC: 5.8 10*3/uL (ref 4.0–10.5)

## 2016-06-28 LAB — COMPREHENSIVE METABOLIC PANEL
ALT: 21 U/L (ref 14–54)
ANION GAP: 8 (ref 5–15)
AST: 22 U/L (ref 15–41)
Albumin: 3.4 g/dL — ABNORMAL LOW (ref 3.5–5.0)
Alkaline Phosphatase: 106 U/L (ref 38–126)
BILIRUBIN TOTAL: 0.1 mg/dL — AB (ref 0.3–1.2)
BUN: 9 mg/dL (ref 6–20)
CO2: 29 mmol/L (ref 22–32)
Calcium: 8.6 mg/dL — ABNORMAL LOW (ref 8.9–10.3)
Chloride: 105 mmol/L (ref 101–111)
Creatinine, Ser: 0.82 mg/dL (ref 0.44–1.00)
GFR calc Af Amer: >60 mL/min (ref >60)
GFR calc non Af Amer: >60 mL/min (ref >60)
Glucose, Bld: 100 mg/dL — ABNORMAL HIGH (ref 65–99)
POTASSIUM: 4 mmol/L (ref 3.5–5.1)
Sodium: 142 mmol/L (ref 135–145)
TOTAL PROTEIN: 7 g/dL (ref 6.5–8.1)

## 2016-06-28 LAB — I-STAT TROPONIN, ED: Troponin i, poc: 0 ng/mL (ref 0.00–0.08)

## 2016-06-28 MED ORDER — ONDANSETRON HCL 4 MG/2ML IJ SOLN: 4.0000 mg | Freq: Three times a day (TID) | INTRAMUSCULAR | Status: DC | PRN

## 2016-06-28 MED ORDER — ACETAMINOPHEN 325 MG PO TABS
650.0000 mg | ORAL_TABLET | Freq: Four times a day (QID) | ORAL | Status: DC | PRN
  Administered 2016-06-30: 650 mg via ORAL
  Filled 2016-06-28: qty 2

## 2016-06-28 MED ORDER — METHYLPREDNISOLONE SODIUM SUCC 125 MG IJ SOLR
60.0000 mg | Freq: Three times a day (TID) | INTRAMUSCULAR | Status: DC
  Administered 2016-06-29 – 2016-07-03 (×13): 60 mg via INTRAVENOUS
  Filled 2016-06-28 (×13): qty 2

## 2016-06-28 MED ORDER — NAPROXEN 250 MG PO TABS
250.0000 mg | ORAL_TABLET | Freq: Two times a day (BID) | ORAL | Status: DC
  Administered 2016-06-29 – 2016-07-04 (×11): 250 mg via ORAL
  Filled 2016-06-28 (×11): qty 1

## 2016-06-28 MED ORDER — CYCLOBENZAPRINE HCL 10 MG PO TABS
10.0000 mg | ORAL_TABLET | Freq: Every day | ORAL | Status: DC
  Administered 2016-06-29 – 2016-07-03 (×6): 10 mg via ORAL
  Filled 2016-06-28 (×6): qty 1

## 2016-06-28 MED ORDER — ALBUTEROL SULFATE (2.5 MG/3ML) 0.083% IN NEBU
INHALATION_SOLUTION | RESPIRATORY_TRACT | Status: AC
  Filled 2016-06-28: qty 3

## 2016-06-28 MED ORDER — IPRATROPIUM BROMIDE 0.02 % IN SOLN: 0.5000 mg | RESPIRATORY_TRACT | Status: DC

## 2016-06-28 MED ORDER — AZITHROMYCIN 250 MG PO TABS
500.0000 mg | ORAL_TABLET | Freq: Every day | ORAL | Status: AC
  Administered 2016-06-29: 500 mg via ORAL
  Filled 2016-06-28: qty 2

## 2016-06-28 MED ORDER — DOCUSATE SODIUM 100 MG PO CAPS
100.0000 mg | ORAL_CAPSULE | Freq: Two times a day (BID) | ORAL | Status: DC | PRN
  Administered 2016-06-29 – 2016-07-04 (×6): 100 mg via ORAL
  Filled 2016-06-28 (×6): qty 1

## 2016-06-28 MED ORDER — AZITHROMYCIN 250 MG PO TABS
250.0000 mg | ORAL_TABLET | Freq: Every day | ORAL | Status: AC
  Administered 2016-06-29 – 2016-07-02 (×4): 250 mg via ORAL
  Filled 2016-06-28 (×4): qty 1

## 2016-06-28 MED ORDER — LORATADINE 10 MG PO TABS
10.0000 mg | ORAL_TABLET | Freq: Every day | ORAL | Status: DC
  Administered 2016-06-29 – 2016-07-04 (×6): 10 mg via ORAL
  Filled 2016-06-28 (×6): qty 1

## 2016-06-28 MED ORDER — ENOXAPARIN SODIUM 40 MG/0.4ML ~~LOC~~ SOLN
40.0000 mg | Freq: Every day | SUBCUTANEOUS | Status: DC
  Administered 2016-06-29 – 2016-07-03 (×6): 40 mg via SUBCUTANEOUS
  Filled 2016-06-28 (×6): qty 0.4

## 2016-06-28 MED ORDER — NICOTINE 21 MG/24HR TD PT24
21.0000 mg | MEDICATED_PATCH | Freq: Every day | TRANSDERMAL | Status: DC
  Administered 2016-06-29 – 2016-07-04 (×6): 21 mg via TRANSDERMAL
  Filled 2016-06-28 (×6): qty 1

## 2016-06-28 MED ORDER — FUSION PLUS PO CAPS: 1.0000 | ORAL_CAPSULE | Freq: Every day | ORAL | Status: DC

## 2016-06-28 MED ORDER — ALBUTEROL SULFATE (2.5 MG/3ML) 0.083% IN NEBU: 2.5000 mg | INHALATION_SOLUTION | RESPIRATORY_TRACT | Status: DC | PRN

## 2016-06-28 MED ORDER — FERROUS FUMARATE 324 (106 FE) MG PO TABS
1.0000 | ORAL_TABLET | Freq: Every day | ORAL | Status: DC
Start: 2016-06-29 — End: 2016-07-04
  Administered 2016-06-29 – 2016-07-03 (×5): 106 mg via ORAL
  Filled 2016-06-28 (×6): qty 1

## 2016-06-28 MED ORDER — OXYCODONE-ACETAMINOPHEN 5-325 MG PO TABS
1.0000 | ORAL_TABLET | ORAL | Status: DC | PRN
  Administered 2016-06-29: 2 via ORAL
  Administered 2016-06-29: 1 via ORAL
  Filled 2016-06-28: qty 2
  Filled 2016-06-28: qty 1

## 2016-06-28 MED ORDER — ZOLPIDEM TARTRATE 5 MG PO TABS
5.0000 mg | ORAL_TABLET | Freq: Every evening | ORAL | Status: DC | PRN
  Administered 2016-06-29 – 2016-07-02 (×4): 5 mg via ORAL
  Filled 2016-06-28 (×4): qty 1

## 2016-06-28 MED ORDER — ALBUTEROL SULFATE (2.5 MG/3ML) 0.083% IN NEBU: 5.0000 mg | INHALATION_SOLUTION | Freq: Once | RESPIRATORY_TRACT | Status: DC

## 2016-06-28 MED ORDER — SODIUM CHLORIDE 0.9 % IV BOLUS (SEPSIS)
1000.0000 mL | Freq: Once | INTRAVENOUS | Status: AC
  Administered 2016-06-29: 1000 mL via INTRAVENOUS

## 2016-06-28 MED ORDER — LEVALBUTEROL HCL 1.25 MG/0.5ML IN NEBU: 1.2500 mg | INHALATION_SOLUTION | Freq: Four times a day (QID) | RESPIRATORY_TRACT | Status: DC

## 2016-06-28 MED ORDER — AMLODIPINE BESYLATE 5 MG PO TABS
5.0000 mg | ORAL_TABLET | Freq: Every day | ORAL | Status: DC
  Administered 2016-06-29 – 2016-06-30 (×3): 5 mg via ORAL
  Filled 2016-06-28 (×3): qty 1

## 2016-06-28 MED ORDER — ALBUTEROL (5 MG/ML) CONTINUOUS INHALATION SOLN
10.0000 mg/h | INHALATION_SOLUTION | RESPIRATORY_TRACT | Status: DC
  Administered 2016-06-28 (×2): 10 mg/h via RESPIRATORY_TRACT
  Filled 2016-06-28: qty 20

## 2016-06-28 MED ORDER — SODIUM CHLORIDE 0.9 % IV SOLN
INTRAVENOUS | Status: DC
  Administered 2016-06-29: 05:00:00 via INTRAVENOUS

## 2016-06-28 MED ORDER — HYDRALAZINE HCL 20 MG/ML IJ SOLN: 5.0000 mg | INTRAMUSCULAR | Status: DC | PRN

## 2016-06-28 MED ORDER — METHYLPREDNISOLONE SODIUM SUCC 125 MG IJ SOLR
125.0000 mg | Freq: Once | INTRAMUSCULAR | Status: AC
  Administered 2016-06-28: 125 mg via INTRAVENOUS
  Filled 2016-06-28: qty 2

## 2016-06-28 MED ORDER — ALBUTEROL SULFATE (2.5 MG/3ML) 0.083% IN NEBU
2.5000 mg | INHALATION_SOLUTION | Freq: Once | RESPIRATORY_TRACT | Status: AC
  Administered 2016-06-28: 2.5 mg via RESPIRATORY_TRACT

## 2016-06-28 MED ORDER — FLUTICASONE PROPIONATE 50 MCG/ACT NA SUSP
2.0000 | Freq: Every day | NASAL | Status: DC
  Administered 2016-06-29 – 2016-07-04 (×6): 2 via NASAL
  Filled 2016-06-28: qty 16

## 2016-06-28 MED ORDER — METHYLPREDNISOLONE SODIUM SUCC 125 MG IJ SOLR: 60.0000 mg | Freq: Three times a day (TID) | INTRAMUSCULAR | Status: DC

## 2016-06-28 MED ORDER — PANTOPRAZOLE SODIUM 40 MG PO TBEC
40.0000 mg | DELAYED_RELEASE_TABLET | Freq: Every day | ORAL | Status: DC
  Administered 2016-06-29 – 2016-07-04 (×6): 40 mg via ORAL
  Filled 2016-06-28 (×6): qty 1

## 2016-06-28 MED ORDER — ACETAMINOPHEN 325 MG PO TABS
650.0000 mg | ORAL_TABLET | Freq: Once | ORAL | Status: AC
  Administered 2016-06-28: 650 mg via ORAL
  Filled 2016-06-28: qty 2

## 2016-06-28 MED ORDER — IPRATROPIUM BROMIDE 0.02 % IN SOLN
0.5000 mg | Freq: Once | RESPIRATORY_TRACT | Status: AC
  Administered 2016-06-28: 0.5 mg via RESPIRATORY_TRACT
  Filled 2016-06-28: qty 2.5

## 2016-06-28 MED ORDER — DM-GUAIFENESIN ER 30-600 MG PO TB12
1.0000 | ORAL_TABLET | Freq: Two times a day (BID) | ORAL | Status: DC
  Administered 2016-06-29 – 2016-07-04 (×12): 1 via ORAL
  Filled 2016-06-28 (×12): qty 1

## 2016-06-28 MED ORDER — IPRATROPIUM-ALBUTEROL 0.5-2.5 (3) MG/3ML IN SOLN: 3.0000 mL | RESPIRATORY_TRACT | Status: DC

## 2016-06-28 NOTE — H&P (Addendum)
History and Physical    MCKAYLIE VASEY EYC:[id number] DOB: September 21, 1970 DOA: 06/28/2016  Referring MD/NP/PA:   PCP: Philis Fendt, MD   Patient coming from:  The patient is coming from home.  At baseline, pt is independent for most of ADL.  Chief Complaint: Shortness of breath, productive cough, chest pain   HPI: Kathleen Guzman is a 45 y.o. female with medical history significant of tobacco abuse, hypertension, depression, anxiety, Arthritis, Degenerative Lumbar Disc Disease, Bronchitis, Who Presents with Shortness of Breath, Productive Cough and Chest Pain.  Patient states that she has been having cough and SON for more than 2 weeks, which has been progressively getting worse. She can barely speak in full sentence. She coughs up yellow colored sputum. No fever or chills. Patient states that she has chest pain, which is induced by coughing. It is located in the frontal chest, 9 out of 10 in severity, sharp, aggravated by coughing. No tenderness in the calf areas. No long distant traveling history. She states that she has mild lower abdominal muscle pain when she has severe coughing. Denies nausea, vomiting or diarrhea, symptoms of UTI. No unilateral weakness. Patient states that her blood pressure medication (lisinopril is on her med list) makes her headache, therefore she is not taking blood pressure medications currently.  ED Course: pt was found to have WBC 5.8, electrolytes renal function okay, negative troponin, temperature 90.9, tachycardia, oxygen saturation 88% on room air, electrolytes renal function okay, negative chest x-ray. Patient is placed on telemetry bed for observation.  Review of Systems:   General: no fevers, chills, no changes in body weight, has fatigue HEENT: no blurry vision, hearing changes or sore throat Respiratory: has dyspnea, coughing, wheezing CV: has chest pain, no palpitations GI: no nausea, vomiting, diarrhea, constipation GU: no dysuria, burning on  urination, increased urinary frequency, hematuria  Ext: no leg edema Neuro: no unilateral weakness, numbness, or tingling, no vision change or hearing loss Skin: no rash, no skin tear. MSK: No muscle spasm, no deformity, no limitation of range of movement in spin Heme: No easy bruising.  Travel history: No recent long distant travel.  Allergy:  Allergies  Allergen Reactions  . Lisinopril Other (See Comments)    Headaches     Past Medical History:  Diagnosis Date  . Anxiety   . Arthritis   . Bronchitis   . Degenerative disc disease, lumbar   . Depression   . Hypertension   . Numbness in both legs     Past Surgical History:  Procedure Laterality Date  . BILATERAL SALPINGECTOMY Bilateral 06/27/2012   Procedure: BILATERAL SALPINGECTOMY;  Surgeon: Lahoma Crocker, MD;  Location: [facility];  Service: Gynecology;  Laterality: Bilateral;  . CESAREAN SECTION    . cesarian     . DILATION AND CURETTAGE OF UTERUS    . ROBOTIC ASSISTED TOTAL HYSTERECTOMY N/A 06/27/2012   Procedure: ROBOTIC ASSISTED TOTAL HYSTERECTOMY;  Surgeon: Lahoma Crocker, MD;  Location: [facility];  Service: Gynecology;  Laterality: N/A;    Social History:  reports that she has been smoking.  She has been smoking about 0.50 packs per day. She has never used smokeless tobacco. She reports that she drinks alcohol. She reports that she does not use drugs.  Family History:  Family History  Problem Relation Age of Onset  . Stomach cancer Mother   . Hypertension Father   . Hypertension Brother      Prior to Admission medications   Medication Sig Start Date  End Date Taking? Authorizing Provider  benzonatate (TESSALON) 100 MG capsule Take 1 capsule (100 mg total) by mouth 3 (three) times daily as needed for cough. 04/12/15   Waynetta Pean, PA-C  cetirizine (ZYRTEC ALLERGY) 10 MG tablet Take 1 tablet (10 mg total) by mouth daily. 04/12/15   Waynetta Pean, PA-C  cyclobenzaprine (FLEXERIL) 10 MG tablet Take 10 mg by  mouth at bedtime.    Historical Provider, MD  ferrous fumarate (HEMOCYTE - 106 MG FE) 325 (106 FE) MG TABS Take 1 tablet by mouth daily.    Historical Provider, MD  fluticasone (FLONASE) 50 MCG/ACT nasal spray Place 2 sprays into both nostrils daily. 04/12/15   Waynetta Pean, PA-C  ibuprofen (ADVIL,MOTRIN) 800 MG tablet Take 1 tablet (800 mg total) by mouth every 6 (six) hours as needed for pain (mild pain). 06/29/12   Shelly Bombard, MD  Iron-FA-B Cmp-C-Biot-Probiotic (FUSION PLUS) CAPS Take 1 capsule by mouth daily before breakfast. 06/29/12   Shelly Bombard, MD  lisinopril (PRINIVIL,ZESTRIL) 10 MG tablet Take 10 mg by mouth every morning.    Historical Provider, MD  loratadine (CLARITIN) 10 MG tablet Take 10 mg by mouth daily.    Historical Provider, MD  naproxen (NAPROSYN) 250 MG tablet Take 1 tablet (250 mg total) by mouth 2 (two) times daily with a meal. 04/12/15   Waynetta Pean, PA-C  oxyCODONE (OXY IR/ROXICODONE) 5 MG immediate release tablet Take 5 mg by mouth every 6 (six) hours as needed. For pain    Historical Provider, MD  oxyCODONE-acetaminophen (PERCOCET/ROXICET) 5-325 MG per tablet Take 1-2 tablets by mouth every 4 (four) hours as needed. 06/29/12   Shelly Bombard, MD    Physical Exam: Vitals:   06/28/16 2200 06/28/16 2245 06/28/16 2319 06/29/16 0000  BP: (!) 149/75 (!) 162/80    Pulse: (!) 101 (!) 114    Resp:    18  Temp:   98.3 F (36.8 C)   TempSrc:   Oral   SpO2: 98% 97%    Weight:   128.8 kg (284 lb)   Height:   5' 3.5" (1.613 m)    General: Not in acute distress HEENT:       Eyes: PERRL, EOMI, no scleral icterus.       ENT: No discharge from the ears and nose, no pharynx injection, no tonsillar enlargement.        Neck: No JVD, no bruit, no mass felt. Heme: No neck lymph node enlargement. Cardiac: S1/S2, RRR, No murmurs, No gallops or rubs. Respiratory: Diffuse rhonchi and wheezing bilaterally.  GI: Soft, nondistended, nontender, no rebound pain, no  organomegaly, BS present. GU: No hematuria Ext: No pitting leg edema bilaterally. 2+DP/PT pulse bilaterally. Musculoskeletal: No joint deformities, No joint redness or warmth, no limitation of ROM in spin. Skin: No rashes.  Neuro: Alert, oriented X3, cranial nerves II-XII grossly intact, moves all extremities normally.  Psych: Patient is not psychotic, no suicidal or hemocidal ideation.  Labs on Admission: I have personally reviewed following labs and imaging studies  CBC:  Recent Labs Lab 06/28/16 1843  WBC 5.8  NEUTROABS 4.0  HGB 12.8  HCT 39.4  MCV 88.7  PLT 623   Basic Metabolic Panel:  Recent Labs Lab 06/28/16 1843  NA 142  K 4.0  CL 105  CO2 29  GLUCOSE 100*  BUN 9  CREATININE 0.82  CALCIUM 8.6*   GFR: Estimated Creatinine Clearance: 112.1 mL/min (by C-G formula based on SCr of 0.82  mg/dL). Liver Function Tests:  Recent Labs Lab 06/28/16 1843  AST 22  ALT 21  ALKPHOS 106  BILITOT 0.1*  PROT 7.0  ALBUMIN 3.4*   No results for input(s): LIPASE, AMYLASE in the last 168 hours. No results for input(s): AMMONIA in the last 168 hours. Coagulation Profile: No results for input(s): INR, PROTIME in the last 168 hours. Cardiac Enzymes:  Recent Labs Lab 06/29/16 0003  TROPONINI <0.03   BNP (last 3 results) No results for input(s): PROBNP in the last 8760 hours. HbA1C: No results for input(s): HGBA1C in the last 72 hours. CBG: No results for input(s): GLUCAP in the last 168 hours. Lipid Profile:  Recent Labs  06/29/16 0003  CHOL 144  HDL 64  LDLCALC 71  TRIG 44  CHOLHDL 2.3   Thyroid Function Tests: No results for input(s): TSH, T4TOTAL, FREET4, T3FREE, THYROIDAB in the last 72 hours. Anemia Panel: No results for input(s): VITAMINB12, FOLATE, FERRITIN, TIBC, IRON, RETICCTPCT in the last 72 hours. Urine analysis:    Component Value Date/Time   COLORURINE YELLOW 02/21/2009 2054   APPEARANCEUR CLEAR 02/21/2009 2054   LABSPEC 1.015  02/21/2009 2054   PHURINE 7.0 02/21/2009 2054   GLUCOSEU 100 (A) 02/21/2009 2054   HGBUR NEGATIVE 02/21/2009 2054   BILIRUBINUR NEGATIVE 02/21/2009 2054   KETONESUR 40 (A) 02/21/2009 2054   PROTEINUR NEGATIVE 02/21/2009 2054   UROBILINOGEN 0.2 02/21/2009 2054   NITRITE NEGATIVE 02/21/2009 2054   LEUKOCYTESUR  02/21/2009 2054    NEGATIVE MICROSCOPIC NOT DONE ON URINES WITH NEGATIVE PROTEIN, BLOOD, LEUKOCYTES, NITRITE, OR GLUCOSE <1000 mg/dL.   Sepsis Labs: @LABRCNTIP (procalcitonin:4,lacticidven:4) )No results found for this or any previous visit (from the past 240 hour(s)).   Radiological Exams on Admission: Dg Chest 2 View  Result Date: 06/28/2016 CLINICAL DATA:  Dyspnea midsternal chest pain EXAM: CHEST  2 VIEW COMPARISON:  04/24/2012 FINDINGS: The heart size and mediastinal contours are within normal limits. Both lungs are clear. The visualized skeletal structures are unremarkable. IMPRESSION: No active cardiopulmonary disease. Electronically Signed   By: Ashley Royalty M.D.   On: 06/28/2016 19:35     EKG: Independently reviewed. Sinus rhythm, tachycardia, QTC 445, poor R-wave progression   Assessment/Plan Principal Problem:   Acute respiratory failure with hypoxia (HCC) Active Problems:   Tobacco abuse   Bronchitis   Essential hypertension   Chest pain   Sepsis ([place])   Acute respiratory failure with hypoxia due to bronchitis versus COPD exacerbation: Patient has SOB, productive cough, wheezing and rhonchi on auscultation, consistent with bronchitis. Patient may have undiagnosed COPD given that patient is a smoker, therefore COPD exacerbation is also possible differential diagnosis. Patient has pleuritic chest pain, but no signs of DVT. low suspicious for PE. Her chest is most likely induced by coughing.  -will place on tele bed for obs -Nebulizers: scheduled atroven and prn xopenex nebs -Solu-Medrol 60 mg IV tid -Oral azithromycin for 5 days.  -Mucinex for cough  -Urine S.  pneumococcal antigen -Follow up blood culture x2, sputum culture, Flu pcr  Addendum:   Sepsis: pt's lactic acid is elevated at 3.2, meets criteria for sepsis with tachycardia, tachypnea and elevated lactate. Currently hemodynamically stable. -will get Procalcitonin and trend lactic acid levels per sepsis protocol. -IVF: 3L of NS bolus in ED, followed by 100 cc/h   (patient has a congestive heart failure, limiting aggressive IV fluids treatment).   Tobacco abuse: -Did counseling about importance of quitting smoking -Nicotine patch  HTN: Patient states that  her blood pressure medications for headache. Given severe coughing, will D/C lisinopril -Amlodipine 5 mg daily -IVF hydralazine when necessary  Pleuritic Chest pain: Patient has pleuritic chest pain, no signs of DVT, low suspicion for PE. Most likely due to coughing- induced muscle pain, but still need to r/o other possibilities, such as ACS. Initial troponin negative. -Troponin 3, -check A1c and FLP -Repeat EKG in morning  DVT ppx: SQ Lovenox Code Status: Full code Family Communication: None at bed side. Disposition Plan:  Anticipate discharge back to previous home environment Consults called:  none Admission status: Obs / tele   Date of Service 06/29/2016    Ivor Costa [facility] Pager [phone]  If 7PM-7AM, please contact night-coverage [email or web address] Password [email or web address] 06/29/2016, 1:50 AM

## 2016-06-28 NOTE — ED Notes (Signed)
Respiratory therapy notified of need for treatment

## 2016-06-28 NOTE — ED Provider Notes (Signed)
[facility] DEPT Provider Note   CSN: [id number] Arrival date & time: 06/28/16  1824     History   Chief Complaint Chief Complaint  Patient presents with  . Shortness of Breath    HPI Kathleen Guzman is a 46 y.o. female.  The history is provided by the patient. No language interpreter was used.   Kathleen Guzman is a 46 y.o. female who presents to the Emergency Department complaining of sob.  Week of cough with congestion and shortness of breath. Symptoms significantly worsened over the last 24 hours. She reports cough productive of yellow sputum. She has chest pressure that is worse with coughing. No fevers, abdominal pain, vomiting, leg swelling or pain. She has a history of bronchitis but she has never felt like this before. She does smoke a half a pack a day. Past Medical History:  Diagnosis Date  . Anxiety   . Arthritis   . Bronchitis   . Degenerative disc disease, lumbar   . Depression   . Hypertension   . Numbness in both legs     Patient Active Problem List   Diagnosis Date Noted  . Acute respiratory failure with hypoxia ([place]) 06/28/2016  . Tobacco abuse 06/28/2016  . Bronchitis 06/28/2016  . Essential hypertension 06/28/2016  . Chest pain 06/28/2016  . COPD exacerbation ([place])   . Fibroids 04/22/2012  . Excessive or frequent menstruation 04/22/2012  . Dysmenorrhea 04/22/2012  . Iron deficiency anemia due to chronic blood loss 04/22/2012    Past Surgical History:  Procedure Laterality Date  . BILATERAL SALPINGECTOMY Bilateral 06/27/2012   Procedure: BILATERAL SALPINGECTOMY;  Surgeon: Lahoma Crocker, MD;  Location: [facility];  Service: Gynecology;  Laterality: Bilateral;  . CESAREAN SECTION    . cesarian     . DILATION AND CURETTAGE OF UTERUS    . ROBOTIC ASSISTED TOTAL HYSTERECTOMY N/A 06/27/2012   Procedure: ROBOTIC ASSISTED TOTAL HYSTERECTOMY;  Surgeon: Lahoma Crocker, MD;  Location: [facility];  Service: Gynecology;  Laterality: N/A;    OB History    No data available       Home Medications    Prior to Admission medications   Medication Sig Start Date End Date Taking? Authorizing Provider  albuterol (PROAIR HFA) 108 (90 Base) MCG/ACT inhaler Inhale 2 puffs into the lungs every 6 (six) hours as needed for wheezing or shortness of breath.   Yes Historical Provider, MD  Aspirin-Salicylamide-Caffeine (BC HEADACHE POWDER PO) Take 1 packet by mouth 2 (two) times daily as needed (for headaches or pain).   Yes Historical Provider, MD  ibuprofen (ADVIL,MOTRIN) 800 MG tablet Take 1 tablet (800 mg total) by mouth every 6 (six) hours as needed for pain (mild pain). 06/29/12  Yes Shelly Bombard, MD  benzonatate (TESSALON) 100 MG capsule Take 1 capsule (100 mg total) by mouth 3 (three) times daily as needed for cough. Patient not taking: Reported on 06/28/2016 04/12/15   Waynetta Pean, PA-C  cetirizine (ZYRTEC ALLERGY) 10 MG tablet Take 1 tablet (10 mg total) by mouth daily. Patient not taking: Reported on 06/28/2016 04/12/15   Waynetta Pean, PA-C  fluticasone [facility]) 50 MCG/ACT nasal spray Place 2 sprays into both nostrils daily. Patient not taking: Reported on 06/28/2016 04/12/15   Waynetta Pean, PA-C  Iron-FA-B Cmp-C-Biot-Probiotic (FUSION PLUS) CAPS Take 1 capsule by mouth daily before breakfast. Patient not taking: Reported on 06/28/2016 06/29/12   Shelly Bombard, MD  naproxen (NAPROSYN) 250 MG tablet Take 1 tablet (250 mg total) by  mouth 2 (two) times daily with a meal. Patient not taking: Reported on 06/28/2016 04/12/15   Waynetta Pean, PA-C  oxyCODONE-acetaminophen (PERCOCET/ROXICET) 5-325 MG per tablet Take 1-2 tablets by mouth every 4 (four) hours as needed. Patient not taking: Reported on 06/28/2016 06/29/12   Shelly Bombard, MD    Family History Family History  Problem Relation Age of Onset  . Stomach cancer Mother   . Hypertension Father   . Hypertension Brother     Social History Social History  Substance Use Topics  . Smoking status:  Current Every Day Smoker    Packs/day: 0.50  . Smokeless tobacco: Never Used  . Alcohol use Yes     Comment: occasional     Allergies   Lisinopril   Review of Systems Review of Systems  All other systems reviewed and are negative.    Physical Exam Updated Vital Signs BP (!) 162/80   Pulse (!) 114   Temp 99 F (37.2 C) (Oral)   Resp 20   Ht 5\' 3"  (1.6 m)   Wt 270 lb (122.5 kg)   LMP 05/29/2012   SpO2 97%   BMI 47.83 kg/m   Physical Exam  Constitutional: She is oriented to person, place, and time. She appears well-developed and well-nourished.  HENT:  Head: Normocephalic and atraumatic.  Cardiovascular: Regular rhythm.   No murmur heard. tachycardic  Pulmonary/Chest:  Tachypnea with accessory muscle use. Diffuse wheezes and rhonchi in all lung fields.  Abdominal: Soft. There is no tenderness. There is no rebound and no guarding.  Musculoskeletal: She exhibits no edema or tenderness.  Neurological: She is alert and oriented to person, place, and time.  Skin: Skin is warm and dry.  Psychiatric:  Anxious  Nursing note and vitals reviewed.    ED Treatments / Results  Labs (all labs ordered are listed, but only abnormal results are displayed) Labs Reviewed  COMPREHENSIVE METABOLIC PANEL - Abnormal; Notable for the following:       Result Value   Glucose, Bld 100 (*)    Calcium 8.6 (*)    Albumin 3.4 (*)    Total Bilirubin 0.1 (*)    All other components within normal limits  CULTURE, BLOOD (ROUTINE X 2)  CULTURE, BLOOD (ROUTINE X 2)  CULTURE, EXPECTORATED SPUTUM-ASSESSMENT  GRAM STAIN  CBC WITH DIFFERENTIAL/PLATELET  INFLUENZA PANEL BY PCR (TYPE A & B)  TROPONIN I  TROPONIN I  TROPONIN I  HEMOGLOBIN A1C  LIPID PANEL  LACTIC ACID, PLASMA  HCG, QUANTITATIVE, PREGNANCY  HIV ANTIBODY (ROUTINE TESTING)  STREP PNEUMONIAE URINARY ANTIGEN  I-STAT TROPOININ, ED    EKG  EKG Interpretation  Date/Time:  Thursday June 28 2016 18:32:16 EDT Ventricular  Rate:  107 PR Interval:  148 QRS Duration: 68 QT Interval:  334 QTC Calculation: 445 R Axis:   36 Text Interpretation:  Sinus tachycardia Biatrial enlargement Anteroseptal infarct , age undetermined Abnormal ECG Confirmed by Hazle Coca [phone]) on 06/28/2016 6:58:50 PM       Radiology Dg Chest 2 View  Result Date: 06/28/2016 CLINICAL DATA:  Dyspnea midsternal chest pain EXAM: CHEST  2 VIEW COMPARISON:  04/24/2012 FINDINGS: The heart size and mediastinal contours are within normal limits. Both lungs are clear. The visualized skeletal structures are unremarkable. IMPRESSION: No active cardiopulmonary disease. Electronically Signed   By: Ashley Royalty M.D.   On: 06/28/2016 19:35    Procedures Procedures (including critical care time)  Medications Ordered in ED Medications  acetaminophen (TYLENOL) tablet 650  mg (not administered)  albuterol (PROVENTIL) (2.5 MG/3ML) 0.083% nebulizer solution 2.5 mg (not administered)  loratadine (CLARITIN) tablet 10 mg (not administered)  fluticasone (FLONASE) 50 MCG/ACT nasal spray 2 spray (not administered)  naproxen (NAPROSYN) tablet 250 mg (not administered)  ferrous fumarate (HEMOCYTE - 106 mg FE) tablet 106 mg of iron (not administered)  oxyCODONE-acetaminophen (PERCOCET/ROXICET) 5-325 MG per tablet 1-2 tablet (not administered)  cyclobenzaprine (FLEXERIL) tablet 10 mg (not administered)  dextromethorphan-guaiFENesin (MUCINEX DM) 30-600 MG per 12 hr tablet 1 tablet (not administered)  ipratropium-albuterol (DUONEB) 0.5-2.5 (3) MG/3ML nebulizer solution 3 mL (not administered)  amLODipine (NORVASC) tablet 5 mg (not administered)  hydrALAZINE (APRESOLINE) injection 5 mg (not administered)  pantoprazole (PROTONIX) EC tablet 40 mg (not administered)  azithromycin (ZITHROMAX) tablet 500 mg (not administered)    Followed by  azithromycin (ZITHROMAX) tablet 250 mg (not administered)  nicotine (NICODERM CQ - dosed in mg/24 hours) patch 21 mg (not  administered)  sodium chloride 0.9 % bolus 1,000 mL (not administered)  0.9 %  sodium chloride infusion (not administered)  docusate sodium (COLACE) capsule 100 mg (not administered)  enoxaparin (LOVENOX) injection 40 mg (not administered)  ondansetron (ZOFRAN) injection 4 mg (not administered)  zolpidem (AMBIEN) tablet 5 mg (not administered)  methylPREDNISolone sodium succinate (SOLU-MEDROL) 125 mg/2 mL injection 60 mg (not administered)  albuterol (PROVENTIL) (2.5 MG/3ML) 0.083% nebulizer solution 2.5 mg (2.5 mg Nebulization Given 06/28/16 1832)  methylPREDNISolone sodium succinate (SOLU-MEDROL) 125 mg/2 mL injection 125 mg (125 mg Intravenous Given 06/28/16 1947)  ipratropium (ATROVENT) nebulizer solution 0.5 mg (0.5 mg Nebulization Given 06/28/16 1929)  acetaminophen (TYLENOL) tablet 650 mg (650 mg Oral Given 06/28/16 2103)     Initial Impression / Assessment and Plan / ED Course  I have reviewed the triage vital signs and the nursing notes.  Pertinent labs & imaging results that were available during my care of the patient were reviewed by me and considered in my medical decision making (see chart for details).     Patient with history of bronchitis here with increased shortness of breath, cough, chest pressure. Following our long nebulous or treatment she reports minimal improvement in symptoms but on repeat lung examination her work of breathing had significantly improved. She does have persistent wheezes and rhonchi bilaterally. Plan to admit to hospital service for COPD exacerbation. Current clinical picture is not consistent with pneumonia, ACS, PE, CHF.  Final Clinical Impressions(s) / ED Diagnoses   Final diagnoses:  COPD exacerbation [facility])    New Prescriptions New Prescriptions   No medications on file     Quintella Reichert, MD 06/28/16 2251

## 2016-06-28 NOTE — ED Notes (Signed)
Respiratory made aware of albuterol order

## 2016-06-28 NOTE — ED Triage Notes (Signed)
Pt c/o shortness of breath and productive cough.  St's she has bronchitis but has never been this bad.  Neb tx given in triage.  Pt st's she has been using her inhaler at home with no relief

## 2016-06-29 DIAGNOSIS — J440 Chronic obstructive pulmonary disease with acute lower respiratory infection: Secondary | ICD-10-CM | POA: Diagnosis present

## 2016-06-29 DIAGNOSIS — J441 Chronic obstructive pulmonary disease with (acute) exacerbation: Secondary | ICD-10-CM | POA: Diagnosis present

## 2016-06-29 DIAGNOSIS — J209 Acute bronchitis, unspecified: Secondary | ICD-10-CM | POA: Diagnosis present

## 2016-06-29 DIAGNOSIS — Z79899 Other long term (current) drug therapy: Secondary | ICD-10-CM | POA: Diagnosis not present

## 2016-06-29 DIAGNOSIS — F329 Major depressive disorder, single episode, unspecified: Secondary | ICD-10-CM | POA: Diagnosis present

## 2016-06-29 DIAGNOSIS — F17210 Nicotine dependence, cigarettes, uncomplicated: Secondary | ICD-10-CM | POA: Diagnosis present

## 2016-06-29 DIAGNOSIS — R000 Tachycardia, unspecified: Secondary | ICD-10-CM | POA: Diagnosis present

## 2016-06-29 DIAGNOSIS — J208 Acute bronchitis due to other specified organisms: Secondary | ICD-10-CM

## 2016-06-29 DIAGNOSIS — I10 Essential (primary) hypertension: Secondary | ICD-10-CM | POA: Diagnosis present

## 2016-06-29 DIAGNOSIS — Z716 Tobacco abuse counseling: Secondary | ICD-10-CM | POA: Diagnosis not present

## 2016-06-29 DIAGNOSIS — Z7951 Long term (current) use of inhaled steroids: Secondary | ICD-10-CM | POA: Diagnosis not present

## 2016-06-29 DIAGNOSIS — A419 Sepsis, unspecified organism: Secondary | ICD-10-CM | POA: Diagnosis present

## 2016-06-29 DIAGNOSIS — J40 Bronchitis, not specified as acute or chronic: Secondary | ICD-10-CM | POA: Diagnosis not present

## 2016-06-29 DIAGNOSIS — Z800 Family history of malignant neoplasm of digestive organs: Secondary | ICD-10-CM | POA: Diagnosis not present

## 2016-06-29 DIAGNOSIS — Z888 Allergy status to other drugs, medicaments and biological substances status: Secondary | ICD-10-CM | POA: Diagnosis not present

## 2016-06-29 DIAGNOSIS — Z6841 Body Mass Index (BMI) 40.0 and over, adult: Secondary | ICD-10-CM | POA: Diagnosis not present

## 2016-06-29 DIAGNOSIS — Z90710 Acquired absence of both cervix and uterus: Secondary | ICD-10-CM | POA: Diagnosis not present

## 2016-06-29 DIAGNOSIS — Z9889 Other specified postprocedural states: Secondary | ICD-10-CM | POA: Diagnosis not present

## 2016-06-29 DIAGNOSIS — J9601 Acute respiratory failure with hypoxia: Secondary | ICD-10-CM | POA: Diagnosis present

## 2016-06-29 DIAGNOSIS — Z720 Tobacco use: Secondary | ICD-10-CM | POA: Diagnosis not present

## 2016-06-29 DIAGNOSIS — F319 Bipolar disorder, unspecified: Secondary | ICD-10-CM | POA: Diagnosis present

## 2016-06-29 DIAGNOSIS — Z8249 Family history of ischemic heart disease and other diseases of the circulatory system: Secondary | ICD-10-CM | POA: Diagnosis not present

## 2016-06-29 DIAGNOSIS — M1990 Unspecified osteoarthritis, unspecified site: Secondary | ICD-10-CM | POA: Diagnosis present

## 2016-06-29 DIAGNOSIS — F419 Anxiety disorder, unspecified: Secondary | ICD-10-CM | POA: Diagnosis present

## 2016-06-29 LAB — LIPID PANEL
Cholesterol: 144 mg/dL (ref 0–200)
HDL: 64 mg/dL (ref >40)
LDL CALC: 71 mg/dL (ref 0–99)
Total CHOL/HDL Ratio: 2.3 RATIO
Triglycerides: 44 mg/dL (ref <150)
VLDL: 9 mg/dL (ref 0–40)

## 2016-06-29 LAB — LACTIC ACID, PLASMA
LACTIC ACID, VENOUS: 3.2 mmol/L — AB (ref 0.5–1.9)
Lactic Acid, Venous: 3 mmol/L (ref 0.5–1.9)
Lactic Acid, Venous: 2.5 mmol/L (ref 0.5–1.9)

## 2016-06-29 LAB — HCG, QUANTITATIVE, PREGNANCY: HCG, BETA CHAIN, QUANT, S: 1 m[IU]/mL (ref <5)

## 2016-06-29 LAB — TROPONIN I

## 2016-06-29 LAB — INFLUENZA PANEL BY PCR (TYPE A & B)
Influenza A By PCR: NEGATIVE
Influenza B By PCR: NEGATIVE

## 2016-06-29 LAB — PROCALCITONIN: Procalcitonin: <0.1 ng/mL

## 2016-06-29 LAB — HIV ANTIBODY (ROUTINE TESTING W REFLEX): HIV Screen 4th Generation wRfx: NONREACTIVE

## 2016-06-29 MED ORDER — SODIUM CHLORIDE 0.9 % IV SOLN
INTRAVENOUS | Status: DC
  Administered 2016-06-29 (×2): via INTRAVENOUS

## 2016-06-29 MED ORDER — ALBUTEROL SULFATE (2.5 MG/3ML) 0.083% IN NEBU
2.5000 mg | INHALATION_SOLUTION | RESPIRATORY_TRACT | Status: DC | PRN
Start: 2016-06-29 — End: 2016-07-04

## 2016-06-29 MED ORDER — IPRATROPIUM-ALBUTEROL 0.5-2.5 (3) MG/3ML IN SOLN
3.0000 mL | Freq: Four times a day (QID) | RESPIRATORY_TRACT | Status: DC
  Administered 2016-06-29 – 2016-07-02 (×13): 3 mL via RESPIRATORY_TRACT
  Filled 2016-06-29 (×13): qty 3

## 2016-06-29 MED ORDER — OXYCODONE-ACETAMINOPHEN 5-325 MG PO TABS
1.0000 | ORAL_TABLET | Freq: Three times a day (TID) | ORAL | Status: DC | PRN
Start: 2016-06-29 — End: 2016-07-04
  Administered 2016-06-29 – 2016-07-04 (×11): 2 via ORAL
  Filled 2016-06-29 (×11): qty 2

## 2016-06-29 MED ORDER — IPRATROPIUM BROMIDE 0.02 % IN SOLN
0.5000 mg | Freq: Two times a day (BID) | RESPIRATORY_TRACT | Status: DC
  Administered 2016-06-29: 0.5 mg via RESPIRATORY_TRACT
  Filled 2016-06-29: qty 2.5

## 2016-06-29 MED ORDER — LEVALBUTEROL HCL 1.25 MG/0.5ML IN NEBU
1.2500 mg | INHALATION_SOLUTION | Freq: Two times a day (BID) | RESPIRATORY_TRACT | Status: DC
  Administered 2016-06-29: 1.25 mg via RESPIRATORY_TRACT
  Filled 2016-06-29: qty 0.5

## 2016-06-29 MED ORDER — SODIUM CHLORIDE 0.9 % IV BOLUS (SEPSIS)
2000.0000 mL | Freq: Once | INTRAVENOUS | Status: AC
  Administered 2016-06-29: 2000 mL via INTRAVENOUS

## 2016-06-29 MED ORDER — BENZONATATE 100 MG PO CAPS
200.0000 mg | ORAL_CAPSULE | Freq: Three times a day (TID) | ORAL | Status: DC
  Administered 2016-06-29 – 2016-07-04 (×15): 200 mg via ORAL
  Filled 2016-06-29 (×17): qty 2

## 2016-06-29 NOTE — Progress Notes (Addendum)
PROGRESS NOTE   Kathleen Guzman  YSH:[id number]    DOB: 1971-03-01    DOA: 06/28/2016  PCP: Kathleen Fendt, MD   I have briefly reviewed patients previous medical records in [facility].  Brief Narrative:  46 year old female with PMH of tobacco abuse, HTN, depression, anxiety and obesity who presented with approximately two-week history of progressively worsening productive cough, chest pain on coughing and dyspnea. She states that her symptoms were also worsened recently when she visited her brother who had a cookout and smoke from burning logs. Noted to be hypoxic at 88% on room air in the ED. Chest x-ray negative. Admitted for acute respiratory failure evaluation and management.   Assessment & Plan:   Principal Problem:   Acute respiratory failure with hypoxia (HCC) Active Problems:   Tobacco abuse   Bronchitis   Essential hypertension   Chest pain   Sepsis ([place])   1. Acute purulent bronchitis (? Viral): Chest x-ray negative. Pro-calcitonin <0.10. Patient was placed empirically on azithromycin-? Discontinue. Supportive treatment. Patient's chest and abdominal pain are musculoskeletal secondary to hacking cough. 2. Possible COPD with exacerbation: No prior history of asthma or COPD but she could have COPD related to prolonged history of smoking. Continue treatment with oxygen, bronchodilator nebulizations, flutter valve, IV steroids and antibiotics. Slowly improving. Tobacco cessation counseled. Recommended outpatient PFTs when acute phase has resolved. 3. Acute respiratory failure with hypoxia: Secondary to problem #1 and 2. Reassess prior to discharge regarding need for home oxygen. 4. Tobacco abuse: Cessation counseled. Continue nicotine patch. 5. Elevated lactate: Likely secondary to acute respiratory failure. Improving. Not clinically septic. 6. Essential hypertension: Mildly uncontrolled. DC lisinopril due to her coughing even prior to acute illness. Continue amlodipine 5 MG  daily. When necessary IV hydralazine. 7. Musculoskeletal chest pain: Secondary to hacking cough. Low index of suspicion for PE or a defect in etiology. Troponins 2 negative. 8. Morbid obesity/Body mass index is 49.52 kg/m.: Encouraged diet, exercise and weight loss.   DVT prophylaxis: Lovenox Code Status: Full Family Communication: None at bedside Disposition: DC home in the next 48-72 hours, depending on clinical improvement and stability.   Consultants:  None   Procedures:  None  Antimicrobials:  Azithromycin    Subjective: Feels slightly better with improvement in her dyspnea but noted bouts of hacking cough with associated anterior chest and abdominal discomfort.  ROS: Headache on coughing.  Objective:  Vitals:   06/29/16 0400 06/29/16 0500 06/29/16 0810 06/29/16 0835  BP:    (!) 146/91  Pulse: (!) 102 97  100  Resp:      Temp:      TempSrc:      SpO2: 94% 95% 92% 95%  Weight:      Height:      Temperature 98.2F, respiratory rate 18 per minute  Examination:  General exam: Moderately built and obese female lying comfortably propped up in bed. Respiratory system: Reduced breath sounds bilaterally with scattered bilateral medium pitched inspiratory and expiratory wheezing but no crackles. Respiratory effort normal. Able to speak in full sentences. Cardiovascular system: S1 & S2 heard, RRR. No JVD, murmurs, rubs, gallops or clicks. No pedal edema. Telemetry: SR. Came in with mild ST. Gastrointestinal system: Abdomen is nondistended, soft and nontender. No organomegaly or masses felt. Normal bowel sounds heard. Central nervous system: Alert and oriented. No focal neurological deficits. Extremities: Symmetric 5 x 5 power. Skin: No rashes, lesions or ulcers Psychiatry: Judgement and insight appear normal. Mood & affect appropriate.  Data Reviewed: I have personally reviewed following labs and imaging studies  CBC:  Recent Labs Lab 06/28/16 1843  WBC 5.8    NEUTROABS 4.0  HGB 12.8  HCT 39.4  MCV 88.7  PLT 417   Basic Metabolic Panel:  Recent Labs Lab 06/28/16 1843  NA 142  K 4.0  CL 105  CO2 29  GLUCOSE 100*  BUN 9  CREATININE 0.82  CALCIUM 8.6*   Liver Function Tests:  Recent Labs Lab 06/28/16 1843  AST 22  ALT 21  ALKPHOS 106  BILITOT 0.1*  PROT 7.0  ALBUMIN 3.4*   Cardiac Enzymes:  Recent Labs Lab 06/29/16 0003 06/29/16 0819  TROPONINI <0.03 <0.03      Radiology Studies: Dg Chest 2 View  Result Date: 06/28/2016 CLINICAL DATA:  Dyspnea midsternal chest pain EXAM: CHEST  2 VIEW COMPARISON:  04/24/2012 FINDINGS: The heart size and mediastinal contours are within normal limits. Both lungs are clear. The visualized skeletal structures are unremarkable. IMPRESSION: No active cardiopulmonary disease. Electronically Signed   By: Kathleen Guzman M.D.   On: 06/28/2016 19:35        Scheduled Meds: . amLODipine  5 mg Oral Daily  . azithromycin  250 mg Oral q1800  . cyclobenzaprine  10 mg Oral QHS  . dextromethorphan-guaiFENesin  1 tablet Oral BID  . enoxaparin (LOVENOX) injection  40 mg Subcutaneous QHS  . Ferrous Fumarate  1 tablet Oral Daily  . fluticasone  2 spray Each Nare Daily  . ipratropium  0.5 mg Nebulization BID  . levalbuterol  1.25 mg Nebulization BID  . loratadine  10 mg Oral Daily  . methylPREDNISolone sodium succinate  60 mg Intravenous Q8H  . naproxen  250 mg Oral BID WC  . nicotine  21 mg Transdermal Daily  . pantoprazole  40 mg Oral Q1200   Continuous Infusions: . sodium chloride 100 mL/hr at 06/29/16 0512     LOS: 0 days     HONGALGI,ANAND, MD, FACP, FHM. [facility] Pager [phone]  If 7PM-7AM, please contact night-coverage [email or web address] Password [facility] 06/29/2016, 12:43 PM

## 2016-06-30 LAB — HEMOGLOBIN A1C
Hgb A1c MFr Bld: 6 % — ABNORMAL HIGH (ref 4.8–5.6)
Mean Plasma Glucose: 126 mg/dL

## 2016-06-30 MED ORDER — AMLODIPINE BESYLATE 5 MG PO TABS
5.0000 mg | ORAL_TABLET | Freq: Once | ORAL | Status: AC
  Administered 2016-06-30: 5 mg via ORAL
  Filled 2016-06-30: qty 1

## 2016-06-30 MED ORDER — AMLODIPINE BESYLATE 10 MG PO TABS
10.0000 mg | ORAL_TABLET | Freq: Every day | ORAL | Status: DC
  Administered 2016-07-01 – 2016-07-04 (×4): 10 mg via ORAL
  Filled 2016-06-30 (×4): qty 1

## 2016-06-30 NOTE — Progress Notes (Signed)
PROGRESS NOTE   Kathleen Guzman  LDJ:[id number]    DOB: 1970/04/16    DOA: 06/28/2016  PCP: Philis Fendt, MD   I have briefly reviewed patients previous medical records in [facility].  Brief Narrative:  46 year old female with PMH of tobacco abuse, HTN, depression, anxiety and obesity who presented with approximately two-week history of progressively worsening productive cough, chest pain on coughing and dyspnea. She states that her symptoms were also worsened recently when she visited her brother who had a cookout and smoke from burning logs. Noted to be hypoxic at 88% on room air in the ED. Chest x-ray negative. Admitted for acute respiratory failure evaluation and management.Slowly improving.   Assessment & Plan:   Principal Problem:   Acute respiratory failure with hypoxia (HCC) Active Problems:   Tobacco abuse   Bronchitis   Essential hypertension   Chest pain   Sepsis ([place])   1. Acute purulent bronchitis (? Viral): Chest x-ray negative. Pro-calcitonin <0.10. Patient was placed empirically on azithromycin - continue. Supportive treatment. Patient's chest and abdominal pain are musculoskeletal secondary to hacking cough. 2. Possible COPD with exacerbation: No prior history of asthma or COPD but she could have COPD related to prolonged history of smoking. Continue treatment with oxygen, bronchodilator nebulizations, flutter valve, IV steroids and antibiotics. Slowly improving. Tobacco cessation counseled. Recommended outpatient PFTs when acute phase has resolved. 3. Acute respiratory failure with hypoxia: Secondary to problem #1 and 2. Reassess prior to discharge regarding need for home oxygen. Discussed with RN and wean off of oxygen as long as saturations >92%. 4. Tobacco abuse: Cessation counseled. Continue nicotine patch. 5. Elevated lactate: Likely secondary to acute respiratory failure. Improving. Not clinically septic. 6. Essential hypertension: Mildly uncontrolled. DC  lisinopril due to her coughing even prior to acute illness. Continue amlodipine 5 MG daily. When necessary IV hydralazine. Increase amlodipine to 10 MG daily. 7. Musculoskeletal chest pain: Secondary to hacking cough. Low index of suspicion for PE. Troponins 2 negative. EKG without acute changes. Improved. 8. Morbid obesity/Body mass index is 50.29 kg/m.: Encouraged diet, exercise and weight loss.   DVT prophylaxis: Lovenox Code Status: Full Family Communication: None at bedside Disposition: DC home in the next 48-72 hours, depending on clinical improvement and stability.   Consultants:  None   Procedures:  None  Antimicrobials:  Azithromycin    Subjective: Continues to feel better. Improved cough and dyspnea. No pain reported.  ROS: Denies.  Objective:  Vitals:   06/30/16 0519 06/30/16 0558 06/30/16 0844 06/30/16 0858  BP: 138/81  (!) 148/104   Pulse: 95  92   Resp: 18     Temp: 98 F (36.7 C)     TempSrc: Oral     SpO2: 94%  94% 92%  Weight:  130.8 kg (288 lb 6.4 oz)    Height:        Examination:  General exam: Moderately built and obese female lying comfortably propped up in bed. Respiratory system: Improved breath sounds. Stable harsh and scattered few bilateral wheezing/rhonchi but better compared to yesterday. Respiratory effort normal. Able to speak in full sentences. Cardiovascular system: S1 & S2 heard, RRR. No JVD, murmurs, rubs, gallops or clicks. No pedal edema. Telemetry: Mostly sinus rhythm. Occasional mild sinus tachycardia. Gastrointestinal system: Abdomen is nondistended, soft and nontender. No organomegaly or masses felt. Normal bowel sounds heard. Central nervous system: Alert and oriented. No focal neurological deficits. Extremities: Symmetric 5 x 5 power. Skin: No rashes, lesions or ulcers  Psychiatry: Judgement and insight appear normal. Mood & affect appropriate.     Data Reviewed: I have personally reviewed following labs and imaging  studies  CBC:  Recent Labs Lab 06/28/16 1843  WBC 5.8  NEUTROABS 4.0  HGB 12.8  HCT 39.4  MCV 88.7  PLT 292   Basic Metabolic Panel:  Recent Labs Lab 06/28/16 1843  NA 142  K 4.0  CL 105  CO2 29  GLUCOSE 100*  BUN 9  CREATININE 0.82  CALCIUM 8.6*   Liver Function Tests:  Recent Labs Lab 06/28/16 1843  AST 22  ALT 21  ALKPHOS 106  BILITOT 0.1*  PROT 7.0  ALBUMIN 3.4*   Cardiac Enzymes:  Recent Labs Lab 06/29/16 0003 06/29/16 0819  TROPONINI <0.03 <0.03      Radiology Studies: Dg Chest 2 View  Result Date: 06/28/2016 CLINICAL DATA:  Dyspnea midsternal chest pain EXAM: CHEST  2 VIEW COMPARISON:  04/24/2012 FINDINGS: The heart size and mediastinal contours are within normal limits. Both lungs are clear. The visualized skeletal structures are unremarkable. IMPRESSION: No active cardiopulmonary disease. Electronically Signed   By: Ashley Royalty M.D.   On: 06/28/2016 19:35        Scheduled Meds: . amLODipine  5 mg Oral Daily  . azithromycin  250 mg Oral q1800  . benzonatate  200 mg Oral TID  . cyclobenzaprine  10 mg Oral QHS  . dextromethorphan-guaiFENesin  1 tablet Oral BID  . enoxaparin (LOVENOX) injection  40 mg Subcutaneous QHS  . Ferrous Fumarate  1 tablet Oral Daily  . fluticasone  2 spray Each Nare Daily  . ipratropium-albuterol  3 mL Nebulization Q6H  . loratadine  10 mg Oral Daily  . methylPREDNISolone sodium succinate  60 mg Intravenous Q8H  . naproxen  250 mg Oral BID WC  . nicotine  21 mg Transdermal Daily  . pantoprazole  40 mg Oral Q1200   Continuous Infusions:    LOS: 1 day     Evee Liska, MD, FACP, FHM. [facility] Pager [phone]  If 7PM-7AM, please contact night-coverage [email or web address] Password [email or web address] 06/30/2016, 10:52 AM

## 2016-07-01 NOTE — Progress Notes (Signed)
Pt states she has been using the flutter valve multiple times a day, per pt: she has used 2 x today (x 10 breaths each time).

## 2016-07-01 NOTE — Progress Notes (Signed)
PROGRESS NOTE   Kathleen Guzman  AJO:[id number]    DOB: 1970/05/08    DOA: 06/28/2016  PCP: Philis Fendt, MD   I have briefly reviewed patients previous medical records in [facility].  Brief Narrative:  46 year old female with PMH of tobacco abuse, HTN, depression, anxiety and obesity who presented with approximately two-week history of progressively worsening productive cough, chest pain on coughing and dyspnea. She states that her symptoms were also worsened recently when she visited her brother who had a cookout and smoke from burning logs. Noted to be hypoxic at 88% on room air in the ED. Chest x-ray negative. Admitted for acute respiratory failure evaluation and management.Slowly improving.   Assessment & Plan:   Principal Problem:   Acute respiratory failure with hypoxia (HCC) Active Problems:   Tobacco abuse   Bronchitis   Essential hypertension   Chest pain   Sepsis ([place])   1. Acute purulent bronchitis (? Viral): Chest x-ray negative. Pro-calcitonin <0.10. Patient was placed empirically on azithromycin - continue. Supportive treatment. Patient's chest and abdominal pain are musculoskeletal secondary to hacking cough. 2. Possible COPD with exacerbation: No prior history of asthma or COPD but she could have COPD related to prolonged history of smoking. Continue treatment with oxygen, bronchodilator nebulizations, flutter valve, IV steroids and antibiotics. Tobacco cessation counseled. Recommended outpatient PFTs when acute phase has resolved.Continues to gradually improve. 3. Acute respiratory failure with hypoxia: Secondary to problem #1 and 2. Reassess prior to discharge regarding need for home oxygen. Discussed with RN and wean off of oxygen as long as saturations >92%. Hypoxia resolved but will need to check desaturation protocol prior to DC. 4. Tobacco abuse: Cessation counseled. Continue nicotine patch. 5. Elevated lactate: Likely secondary to acute respiratory failure.  Improving. Not clinically septic. 6. Essential hypertension: Mildly uncontrolled. DC lisinopril due to her coughing even prior to acute illness. Continue amlodipine 5 MG daily. When necessary IV hydralazine. Increased amlodipine to 10 MG daily. 7. Musculoskeletal chest pain: Secondary to hacking cough. Low index of suspicion for PE. Troponins 2 negative. EKG without acute changes. Resolved. 8. Morbid obesity/Body mass index is 50.04 kg/m.: Encouraged diet, exercise and weight loss.   DVT prophylaxis: Lovenox Code Status: Full Family Communication: None at bedside Disposition: DC home in the next 24-48 hours, depending on clinical improvement and stability.   Consultants:  None   Procedures:  None  Antimicrobials:  Azithromycin    Subjective: States that her symptoms are 40% better. Decreasing cough. Sputum at times yellow colored. Dyspnea improving. Still has some audible wheezing.  ROS: Denies.  Objective:  Vitals:   06/30/16 1558 06/30/16 2017 06/30/16 2151 07/01/16 0552  BP:   (!) 140/97 (!) 144/100  Pulse: 96  (!) 118 (!) 105  Resp: 15     Temp: 98.3 F (36.8 C)  98 F (36.7 C) 97.7 F (36.5 C)  TempSrc: Oral  Oral Oral  SpO2: 98% 98% 95% 96%  Weight:    130.2 kg (287 lb)  Height:        Examination:  General exam: Moderately built and obese female lying comfortably propped up in bed. Respiratory system: Air entry continues to improve. Still slightly harsh bilaterally and scattered few bilateral rhonchi but improving every day. Respiratory effort normal. Able to speak in full sentences. Cardiovascular system: S1 & S2 heard, RRR. No JVD, murmurs, rubs, gallops or clicks. No pedal edema. Gastrointestinal system: Abdomen is nondistended, soft and nontender. No organomegaly or masses felt. Normal  bowel sounds heard. Central nervous system: Alert and oriented. No focal neurological deficits. Extremities: Symmetric 5 x 5 power. Skin: No rashes, lesions or  ulcers Psychiatry: Judgement and insight appear normal. Mood & affect appropriate.     Data Reviewed: I have personally reviewed following labs and imaging studies  CBC:  Recent Labs Lab 06/28/16 1843  WBC 5.8  NEUTROABS 4.0  HGB 12.8  HCT 39.4  MCV 88.7  PLT 440   Basic Metabolic Panel:  Recent Labs Lab 06/28/16 1843  NA 142  K 4.0  CL 105  CO2 29  GLUCOSE 100*  BUN 9  CREATININE 0.82  CALCIUM 8.6*   Liver Function Tests:  Recent Labs Lab 06/28/16 1843  AST 22  ALT 21  ALKPHOS 106  BILITOT 0.1*  PROT 7.0  ALBUMIN 3.4*   Cardiac Enzymes:  Recent Labs Lab 06/29/16 0003 06/29/16 0819  TROPONINI <0.03 <0.03      Radiology Studies: No results found.      Scheduled Meds: . amLODipine  10 mg Oral Daily  . azithromycin  250 mg Oral q1800  . benzonatate  200 mg Oral TID  . cyclobenzaprine  10 mg Oral QHS  . dextromethorphan-guaiFENesin  1 tablet Oral BID  . enoxaparin (LOVENOX) injection  40 mg Subcutaneous QHS  . Ferrous Fumarate  1 tablet Oral Daily  . fluticasone  2 spray Each Nare Daily  . ipratropium-albuterol  3 mL Nebulization Q6H  . loratadine  10 mg Oral Daily  . methylPREDNISolone sodium succinate  60 mg Intravenous Q8H  . naproxen  250 mg Oral BID WC  . nicotine  21 mg Transdermal Daily  . pantoprazole  40 mg Oral Q1200   Continuous Infusions:    LOS: 2 days     Braylei Totino, MD, FACP, FHM. [facility] Pager [phone]  If 7PM-7AM, please contact night-coverage [email or web address] Password [facility] 07/01/2016, 10:56 AM

## 2016-07-02 ENCOUNTER — Inpatient Hospital Stay (HOSPITAL_COMMUNITY): Payer: Medicaid Other

## 2016-07-02 NOTE — Progress Notes (Signed)
SATURATION QUALIFICATIONS: (This note is used to comply with regulatory documentation for home oxygen)  Patient Saturations on Room Air at Rest = 93%  Patient Saturations on Room Air while Ambulating = 98%  Patient Saturations on (none needed) Liters of oxygen while Ambulating = NA%  Please briefly explain why patient needs home oxygen: Patients stats remained above 92% at rest and while ambulating.

## 2016-07-02 NOTE — Progress Notes (Signed)
Ambulating with the patient for about 500 ft. Stopping six times to "catch her breath" stating, "she doesn't work and is not as active as she should be, but it is worse now than normal." Having labored, pursed lips, and tachypnea ~30's. After the walk she complained of being lighting headed. Stats remain above 92% not required oxygen. I will continue to monitor the patient closely.   Saddie Benders RN

## 2016-07-02 NOTE — Progress Notes (Signed)
PROGRESS NOTE   Kathleen Guzman  XKG:[id number]    DOB: [birth year]    DOA: 06/28/2016  PCP: Philis Fendt, MD   I have briefly reviewed patients previous medical records in [facility].  Brief Narrative:  46 year old female with PMH of tobacco abuse, HTN, depression, anxiety and obesity who presented with approximately two-week history of progressively worsening productive cough, chest pain on coughing and dyspnea. She states that her symptoms were also worsened recently when she visited her brother who had a cookout and smoke from burning logs. Noted to be hypoxic at 88% on room air in the ED. Chest x-ray negative. Admitted for acute respiratory failure evaluation and management.Slowly improving.Hypoxia has resolved but patient remains quite symptomatic of dyspnea, with minimal activity. May need another one or 2 days in-hospital stabilization prior to discharge.   Assessment & Plan:   Principal Problem:   Acute respiratory failure with hypoxia (HCC) Active Problems:   Tobacco abuse   Bronchitis   Essential hypertension   Chest pain   Sepsis ([place])   1. Acute purulent bronchitis (? Viral): Chest x-ray negative. Pro-calcitonin <0.10. Patient was placed empirically on azithromycin - we will complete course on 4/9. Supportive treatment. Patient's musculoskeletal chest and abdominal pain from hacking cough has resolved. 2. Possible COPD with exacerbation: No prior history of asthma or COPD but she could have COPD related to prolonged history of smoking. Continue treatment with oxygen, bronchodilator nebulizations, flutter valve, IV steroids and antibiotics. Tobacco cessation counseled. Recommended outpatient PFTs when acute phase has resolved.Continues to gradually improve. Despite resolution of hypoxia, still quite symptomatic of dyspnea. Continue current management and may need another day or 2 inpatient stabilization prior to discharge. If does not continue to make steady progress,  consider pulmonology consultation. Repeat chest x-ray today. 3. Acute respiratory failure with hypoxia: Secondary to problem #1 and 2. Reassess prior to discharge regarding need for home oxygen. Hypoxia resolved but will need to check desaturation protocol prior to DC. 4. Tobacco abuse: Cessation counseled. Continue nicotine patch. 5. Elevated lactate: Likely secondary to acute respiratory failure. Improving. Not clinically septic. 6. Essential hypertension: Mildly uncontrolled. DC lisinopril due to her coughing even prior to acute illness. When necessary IV hydralazine. Increased amlodipine to 10 MG daily. 7. Musculoskeletal chest pain: Secondary to hacking cough. Low index of suspicion for PE. Troponins 2 negative. EKG without acute changes. Resolved. 8. Morbid obesity/Body mass index is 50.08 kg/m.: Encouraged diet, exercise and weight loss.   DVT prophylaxis: Lovenox Code Status: Full Family Communication: None at bedside Disposition: DC home in the next 24-48 hours, depending on clinical improvement and stability.   Consultants:  None   Procedures:  None  Antimicrobials:  Azithromycin    Subjective: Dyspnea and cough continued to slowly improve. However still wheezy. Overnight had some chest tightness requiring nebulizations. Ambulated with RN for 500 feet and had to stop multiple times to catch her breath, labored pursed lipped breathing and tachypnea.  ROS: Denies.  Objective:  Vitals:   07/02/16 0230 07/02/16 0619 07/02/16 0946 07/02/16 0949  BP:  (!) 145/106  (!) 160/97  Pulse:  79    Resp:  18    Temp:  98.4 F (36.9 C)    TempSrc:  Oral    SpO2: 95% 95% 93%   Weight:  130.3 kg (287 lb 3.2 oz)    Height:        Examination:  General exam: Moderately built and obese female lying comfortably propped up  in bed. Respiratory system: Air entry continues to improve. Still slightly harsh bilaterally and scattered few bilateral rhonchi but improving every day.  Respiratory effort normal. Able to speak in full sentences.Intermittently pursed lip breathing. Cardiovascular system: S1 & S2 heard, RRR. No JVD, murmurs, rubs, gallops or clicks. No pedal edema. Gastrointestinal system: Abdomen is nondistended, soft and nontender. No organomegaly or masses felt. Normal bowel sounds heard. Central nervous system: Alert and oriented. No focal neurological deficits. Extremities: Symmetric 5 x 5 power. Skin: No rashes, lesions or ulcers Psychiatry: Judgement and insight appear normal. Mood & affect appropriate.     Data Reviewed: I have personally reviewed following labs and imaging studies  CBC:  Recent Labs Lab 06/28/16 1843  WBC 5.8  NEUTROABS 4.0  HGB 12.8  HCT 39.4  MCV 88.7  PLT 416   Basic Metabolic Panel:  Recent Labs Lab 06/28/16 1843  NA 142  K 4.0  CL 105  CO2 29  GLUCOSE 100*  BUN 9  CREATININE 0.82  CALCIUM 8.6*   Liver Function Tests:  Recent Labs Lab 06/28/16 1843  AST 22  ALT 21  ALKPHOS 106  BILITOT 0.1*  PROT 7.0  ALBUMIN 3.4*   Cardiac Enzymes:  Recent Labs Lab 06/29/16 0003 06/29/16 0819  TROPONINI <0.03 <0.03      Radiology Studies: No results found.      Scheduled Meds: . amLODipine  10 mg Oral Daily  . azithromycin  250 mg Oral q1800  . benzonatate  200 mg Oral TID  . cyclobenzaprine  10 mg Oral QHS  . dextromethorphan-guaiFENesin  1 tablet Oral BID  . enoxaparin (LOVENOX) injection  40 mg Subcutaneous QHS  . Ferrous Fumarate  1 tablet Oral Daily  . fluticasone  2 spray Each Nare Daily  . ipratropium-albuterol  3 mL Nebulization Q6H  . loratadine  10 mg Oral Daily  . methylPREDNISolone sodium succinate  60 mg Intravenous Q8H  . naproxen  250 mg Oral BID WC  . nicotine  21 mg Transdermal Daily  . pantoprazole  40 mg Oral Q1200   Continuous Infusions:    LOS: 3 days     Vahan Wadsworth, MD, FACP, FHM. [facility] Pager [phone]  If 7PM-7AM, please contact  night-coverage [email or web address] Password [email or web address] 07/02/2016, 11:12 AM

## 2016-07-03 MED ORDER — PREDNISONE 20 MG PO TABS
50.0000 mg | ORAL_TABLET | Freq: Every day | ORAL | Status: DC
  Administered 2016-07-04: 09:00:00 50 mg via ORAL
  Filled 2016-07-03: qty 2

## 2016-07-03 MED ORDER — BUSPIRONE HCL 10 MG PO TABS
10.0000 mg | ORAL_TABLET | Freq: Two times a day (BID) | ORAL | Status: DC
  Administered 2016-07-03 – 2016-07-04 (×2): 10 mg via ORAL
  Filled 2016-07-03 (×2): qty 1

## 2016-07-03 MED ORDER — IPRATROPIUM-ALBUTEROL 0.5-2.5 (3) MG/3ML IN SOLN
3.0000 mL | Freq: Three times a day (TID) | RESPIRATORY_TRACT | Status: DC
  Administered 2016-07-03 – 2016-07-04 (×4): 3 mL via RESPIRATORY_TRACT
  Filled 2016-07-03 (×4): qty 3

## 2016-07-03 NOTE — Progress Notes (Signed)
Patient had just used Flutter Valve prior to breathing treatment. Patient has great effort with Flutter and was able to cough up a small amount of yellow, Kamira Mellette sputum. RT will continue to monitor.

## 2016-07-03 NOTE — Progress Notes (Signed)
PROGRESS NOTE   Kathleen Guzman  WNI:[id number]    DOB: 08/02/1970    DOA: 06/28/2016  PCP: Philis Fendt, MD   I have briefly reviewed patients previous medical records in [facility].  Brief Narrative:  68 ? tobacco abuse,  HTN,  depression, anxiety and obesity   who presented with approximately two-week history of progressively worsening productive cough, chest pain on coughing and dyspnea.  She states that her symptoms were also worsened recently when she visited her brother who had a cookout and smoke from burning logs.  Noted to be hypoxic at 88% on room air in the ED. Chest x-ray negative. Admitted for acute respiratory failure evaluation and management.Slowly improving .Hypoxia has resolved but patient remains quite symptomatic of dyspnea, with minimal activity. May need another one or 2 days in-hospital stabilization prior to discharge.   Assessment & Plan:   Principal Problem:   Acute respiratory failure with hypoxia (HCC) Active Problems:   Tobacco abuse   Bronchitis   Essential hypertension   Chest pain   Sepsis ([place])   1. Acute purulent bronchitis (? Viral): Chest x-ray negative 4/9. Pro-calcitonin <0.10. Patient was placed empirically on azithromycin - completed course on 4/9. Supportive treatment. Patient's musculoskeletal chest and abdominal pain from hacking cough has resolved-cont supportive measures 2. 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 Continue  bronchodilator nebulizations, flutter valve, IV steroids and antibiotics. Tobacco cessation counseled. needs outpatient PFTs when acute phase has resolved.Continues to gradually improve. Despite resolution of hypoxia, still quite symptomatic of dyspnea. Continue current management--likely close to d/c in 24 h.  Repeat chest x-ray 4/9 reassuring with no specific acute findings--confirms ? COPD 3. Acute respiratory failure with hypoxia:  Secondary to problem #1 and 2. Hypoxia resolved, desat screen is neg 4. Tobacco abuse: Cessation counseled. Continue nicotine patch. 5. Elevated lactate: Likely secondary to acute respiratory failure. Improving. Not clinically septic. 6. Essential hypertension: Mildly uncontrolled. DC lisinopril due to her coughing even prior to acute illness. When necessary IV hydralazine.amlodipine 10 MG daily. 7. Musculoskeletal chest pain: Secondary to hacking cough. Low index of suspicion for PE. Troponins 2 negative. EKG without acute changes. Resolved. 8. Morbid obesity/Body mass index is 49.99 kg/m.: Encouraged diet, exercise and weight loss. 9. Bipolar with anxiety predminant-Start on Buspar 10 bid.  Titrate as OP--need sdiscussion with pcp re" further therapy   DVT prophylaxis: Lovenox Code Status: Full Family Communication: None at bedside Disposition: DC home in the next 24-48 hours, depending on clinical improvement and stability.   Consultants:  None   Procedures:  None  Antimicrobials:  Azithromycin    Subjective:  Fair Anxious asking for medicaiton for anxiety No cp No fever Feels she is not bringing up her sputum No n.  ROS: Denies.  Objective:  Vitals:   07/02/16 2027 07/02/16 2108 07/03/16 0536 07/03/16 0756  BP:  (!) 154/95 (!) 146/97   Pulse:  77 62   Resp:   20   Temp:  98.5 F (36.9 C) 98.4 F (36.9 C)   TempSrc:  Oral Oral   SpO2: 94% 96% 96% 95%  Weight:   130 kg (286 lb 11.2 oz)   Height:        Examination:  General exam: Moderately built and obese female lying comfortably propped up in bed. Respiratory system: Air entry fair-forced wheeze heard at neck Cardiovascular system: S1 & S2 heard, RRR. No JVD, murmurs, rubs,  gallops or clicks. No pedal edema. Gastrointestinal system: Abdomen is nondistended, soft and nontender. No organomegaly or masses felt. Normal bowel sounds heard. Central nervous system: Alert and oriented. No focal neurological  deficits. Extremities: Symmetric 5 x 5 power. Skin: No rashes, lesions or ulcers Psychiatry: Judgement and insight appear normal. Mood & affect appropriate.     Data Reviewed: I have personally reviewed following labs and imaging studies  CBC:  Recent Labs Lab 06/28/16 1843  WBC 5.8  NEUTROABS 4.0  HGB 12.8  HCT 39.4  MCV 88.7  PLT 616   Basic Metabolic Panel:  Recent Labs Lab 06/28/16 1843  NA 142  K 4.0  CL 105  CO2 29  GLUCOSE 100*  BUN 9  CREATININE 0.82  CALCIUM 8.6*   Liver Function Tests:  Recent Labs Lab 06/28/16 1843  AST 22  ALT 21  ALKPHOS 106  BILITOT 0.1*  PROT 7.0  ALBUMIN 3.4*   Cardiac Enzymes:  Recent Labs Lab 06/29/16 0003 06/29/16 0819  TROPONINI <0.03 <0.03    Radiology Studies: Dg Chest 2 View  Result Date: 07/02/2016 CLINICAL DATA:  COPD exacerbation, current smoker. EXAM: CHEST  2 VIEW COMPARISON:  Chest x-ray of June 28, 2016 FINDINGS: The lungs are well-expanded. The interstitial markings are mildly prominent but stable. There is no alveolar infiltrate or pleural effusion. The heart and pulmonary vascularity are normal. The bony thorax is unremarkable. IMPRESSION: COPD. There is no pneumonia, CHF, nor other acute cardiopulmonary abnormality. Electronically Signed   By: David  Martinique M.D.   On: 07/02/2016 12:02    Scheduled Meds: . amLODipine  10 mg Oral Daily  . benzonatate  200 mg Oral TID  . busPIRone  10 mg Oral BID  . cyclobenzaprine  10 mg Oral QHS  . dextromethorphan-guaiFENesin  1 tablet Oral BID  . enoxaparin (LOVENOX) injection  40 mg Subcutaneous QHS  . Ferrous Fumarate  1 tablet Oral Daily  . fluticasone  2 spray Each Nare Daily  . ipratropium-albuterol  3 mL Nebulization TID  . loratadine  10 mg Oral Daily  . naproxen  250 mg Oral BID WC  . nicotine  21 mg Transdermal Daily  . pantoprazole  40 mg Oral Q1200  . [START ON 07/04/2016] predniSONE  50 mg Oral Q breakfast   Continuous Infusions:    LOS: 4  days    Verneita Griffes, MD [facility] ([phone]   If 7PM-7AM, please contact night-coverage [email or web address] Password [email or web address] 07/03/2016, 11:08 AM

## 2016-07-04 LAB — BASIC METABOLIC PANEL
Anion gap: 9 (ref 5–15)
BUN: 19 mg/dL (ref 6–20)
CALCIUM: 8.1 mg/dL — AB (ref 8.9–10.3)
CO2: 31 mmol/L (ref 22–32)
CREATININE: 0.65 mg/dL (ref 0.44–1.00)
Chloride: 98 mmol/L — ABNORMAL LOW (ref 101–111)
GFR calc Af Amer: >60 mL/min (ref >60)
GLUCOSE: 117 mg/dL — AB (ref 65–99)
Potassium: 3.5 mmol/L (ref 3.5–5.1)
Sodium: 138 mmol/L (ref 135–145)

## 2016-07-04 LAB — CBC
HCT: 39.5 % (ref 36.0–46.0)
Hemoglobin: 12.7 g/dL (ref 12.0–15.0)
MCH: 28.7 pg (ref 26.0–34.0)
MCHC: 32.2 g/dL (ref 30.0–36.0)
MCV: 89.4 fL (ref 78.0–100.0)
PLATELETS: 363 10*3/uL (ref 150–400)
RBC: 4.42 MIL/uL (ref 3.87–5.11)
RDW: 14.6 % (ref 11.5–15.5)
WBC: 13.5 10*3/uL — AB (ref 4.0–10.5)

## 2016-07-04 LAB — CULTURE, BLOOD (ROUTINE X 2)
Culture: NO GROWTH
Culture: NO GROWTH
SPECIAL REQUESTS: ADEQUATE
Special Requests: ADEQUATE

## 2016-07-04 MED ORDER — AEROCHAMBER PLUS FLO-VU LARGE MISC: 1.0000 | Freq: Once | 0 refills | Status: AC

## 2016-07-04 MED ORDER — AEROCHAMBER PLUS FLO-VU LARGE MISC
1.0000 | Freq: Once | Status: AC
  Administered 2016-07-04: 1
  Filled 2016-07-04: qty 1

## 2016-07-04 MED ORDER — BENZONATATE 100 MG PO CAPS: 100.0000 mg | ORAL_CAPSULE | Freq: Three times a day (TID) | ORAL | 0 refills | Status: AC | PRN

## 2016-07-04 MED ORDER — PREDNISONE 50 MG PO TABS: 50.0000 mg | ORAL_TABLET | Freq: Every day | ORAL | 0 refills | Status: AC

## 2016-07-04 MED ORDER — AMLODIPINE BESYLATE 10 MG PO TABS: 10.0000 mg | ORAL_TABLET | Freq: Every day | ORAL | 1 refills | Status: AC

## 2016-07-04 MED ORDER — BUSPIRONE HCL 10 MG PO TABS: 10.0000 mg | ORAL_TABLET | Freq: Two times a day (BID) | ORAL | 0 refills | Status: AC

## 2016-07-04 MED ORDER — NICOTINE 21 MG/24HR TD PT24: 21.0000 mg | MEDICATED_PATCH | Freq: Every day | TRANSDERMAL | 0 refills | Status: AC

## 2016-07-04 MED ORDER — FLUTICASONE FUROATE-VILANTEROL 200-25 MCG/INH IN AEPB: 1.0000 | INHALATION_SPRAY | Freq: Every day | RESPIRATORY_TRACT | 0 refills | Status: AC

## 2016-07-04 MED ORDER — PANTOPRAZOLE SODIUM 40 MG PO TBEC: 40.0000 mg | DELAYED_RELEASE_TABLET | Freq: Every day | ORAL | 0 refills | Status: AC

## 2016-07-04 NOTE — Progress Notes (Signed)
Per pt, she has been using flutter valve multiple times a day on her own.

## 2016-07-04 NOTE — Progress Notes (Signed)
Provided education on spacer use/proper technique.  All questions answered.

## 2016-07-04 NOTE — Discharge Summary (Signed)
Physician Discharge Summary  Kathleen Guzman JAS:[id number] DOB: 1970-10-29 DOA: 06/28/2016  PCP: Philis Fendt, MD  Admit date: 06/28/2016 Discharge date: 07/04/2016  Time spent: 40 minutes  Recommendations for Outpatient Follow-up:  1. This patient will need proper instructions regarding use of inhaler with spacer 2. Breo-eilliptra was added to medications on this admission 3. Patient will finish a burst of steroids prednisone 50 mg on 07/09/16 4. Patient will need smoking cessation counseling as an out agent and has been prescribed nicotine patch 5. Would recommend CBC and basic metabolic panel in 1 month 6. Would recommend screening for bipolar specifically anxiety-started on BuSpar this admission and no other controlled substances have been prescribed 7. Consider referral for out patient pulmonary function testing after resolution of symptoms in about one month  Discharge Diagnoses:  Principal Problem:   Acute respiratory failure with hypoxia ([place]) Active Problems:   Tobacco abuse   Bronchitis   Essential hypertension   Chest pain   Sepsis [facility])   Discharge Condition: Improved  Diet recommendation: Heart healthy  Filed Weights   07/02/16 0619 07/03/16 0536 07/04/16 0451  Weight: 130.3 kg (287 lb 3.2 oz) 130 kg (286 lb 11.2 oz) 130.8 kg (288 lb 6.4 oz)    History of present illness:  tobacco abuse,  HTN,  depression, anxiety and obesity   presented 06/28/16 with approximately two-week history of progressively worsening productive cough, chest pain on coughing and dyspnea.  She states that her symptoms were also worsened recently when she visited her brother who had a cookout and smoke when she arrived her sats were in the 33 percent telemetry and she was admitted see below  Hospital Course:  1. Acute purulent bronchitis (? Viral): Chest x-ray negative 4/9. Pro-calcitonin <0.10. Patient was placed empirically on azithromycin - completed course on 4/9. Supportive treatment.  Patient's musculoskeletal chest and abdominal pain from hacking cough has resolved-cont supportive measures 2. 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 Continue bronchodilator nebulizations, flutter valve, IV steroids and antibiotics. Tobacco cessation counseled. needs outpatient PFTs when acute phase has resolved.Continues to gradually improve. Despite resolution of hypoxia, still quite symptomatic of dyspnea--felt to need more anxiety related. Patient also has been prescribed Breo as might have need for an inhaled steroid in addition 3. Acute respiratory failure with hypoxia: Secondary to problem #1 and 2. Hypoxia resolved, desat screen is neg 4. Tobacco abuse: Cessation counseled. Continue nicotine patch. 5. Elevated lactate: Likely secondary to acute respiratory failure. Improving. Not clinically septic. 6. Essential hypertension: Mildly uncontrolled. DC lisinopril due to her coughing even prior to acute illness. When necessary IV hydralazine. amlodipine 10 MG daily. 7. Musculoskeletal chest pain: Secondary to hacking cough. Low index of suspicion for PE. Troponins 2 negative. EKG without acute changes. Resolved. 8. Morbid obesity/Body mass index is 49.99 kg/m.: Encouraged diet, exercise and weight loss. 9. Bipolar with anxiety predminant-Start on Buspar 10 bid.  Titrate as OP--need sdiscussion with pcp re" further therapy   Discharge Exam: Vitals:   07/03/16 2027 07/04/16 0451  BP: (!) 139/94 (!) 147/89  Pulse: 72 91  Resp: 18 16  Temp: 98.3 F (36.8 C) 98 F (36.7 C)   Anxious but otherwise pleasant and no further issues  General: EOMI NCAT Cardiovascular: S1-S2 no murmur rub or gallop Respiratory: Clinically clear with mild wheeze  Discharge Instructions   Discharge Instructions    Diet - low sodium heart healthy  Complete by:  As directed    Discharge instructions    Complete by:  As directed     Follow with PCP for pain meds and controlled substances Needs OP management of multiple issues--you will need a spacer and I will prescribe on for you so that we can get you the type of therapy that you need  Avoid smoking-we will give you a patch Complete albuterol therapy   Increase activity slowly    Complete by:  As directed      Current Discharge Medication List    START taking these medications   Details  amLODipine (NORVASC) 10 MG tablet Take 1 tablet (10 mg total) by mouth daily. Qty: 30 tablet, Refills: 1    busPIRone (BUSPAR) 10 MG tablet Take 1 tablet (10 mg total) by mouth 2 (two) times daily. Qty: 60 tablet, Refills: 0    fluticasone furoate-vilanterol (BREO ELLIPTA) 200-25 MCG/INH AEPB Inhale 1 puff into the lungs daily. Qty: 1 each, Refills: 0    nicotine (NICODERM CQ - DOSED IN MG/24 HOURS) 21 mg/24hr patch Place 1 patch (21 mg total) onto the skin daily. Qty: 28 patch, Refills: 0    pantoprazole (PROTONIX) 40 MG tablet Take 1 tablet (40 mg total) by mouth daily at 12 noon. Qty: 30 tablet, Refills: 0    predniSONE (DELTASONE) 50 MG tablet Take 1 tablet (50 mg total) by mouth daily with breakfast. Qty: 5 tablet, Refills: 0    Spacer/Aero-Holding Chambers (AEROCHAMBER PLUS FLO-VU LARGE) MISC 1 each by Other route once. Qty: 1 each, Refills: 0      CONTINUE these medications which have NOT CHANGED   Details  albuterol (PROAIR HFA) 108 (90 Base) MCG/ACT inhaler Inhale 2 puffs into the lungs every 6 (six) hours as needed for wheezing or shortness of breath.    Aspirin-Salicylamide-Caffeine (BC HEADACHE POWDER PO) Take 1 packet by mouth 2 (two) times daily as needed (for headaches or pain).    ibuprofen (ADVIL,MOTRIN) 800 MG tablet Take 1 tablet (800 mg total) by mouth every 6 (six) hours as needed for pain (mild pain). Qty: 30 tablet, Refills: 5    benzonatate (TESSALON) 100 MG capsule Take 1 capsule (100 mg total) by mouth 3 (three) times daily as needed for  cough. Qty: 21 capsule, Refills: 0    cetirizine (ZYRTEC ALLERGY) 10 MG tablet Take 1 tablet (10 mg total) by mouth daily. Qty: 30 tablet, Refills: 1    fluticasone (FLONASE) 50 MCG/ACT nasal spray Place 2 sprays into both nostrils daily. Qty: 16 g, Refills: 0    Iron-FA-B Cmp-C-Biot-Probiotic (FUSION PLUS) CAPS Take 1 capsule by mouth daily before breakfast. Qty: 30 capsule, Refills: 5    oxyCODONE-acetaminophen (PERCOCET/ROXICET) 5-325 MG per tablet Take 1-2 tablets by mouth every 4 (four) hours as needed. Qty: 40 tablet, Refills: 0      STOP taking these medications     naproxen (NAPROSYN) 250 MG tablet      cyclobenzaprine (FLEXERIL) 10 MG tablet      ferrous fumarate (HEMOCYTE - 106 MG FE) 325 (106 FE) MG TABS        Allergies  Allergen Reactions  . Lisinopril Other (See Comments)    Headaches       The results of significant diagnostics from this hospitalization (including imaging, microbiology, ancillary and laboratory) are listed below for reference.    Significant Diagnostic Studies: Dg Chest 2 View  Result Date: 07/02/2016 CLINICAL DATA:  COPD exacerbation, current smoker. EXAM: CHEST  2 VIEW COMPARISON:  Chest x-ray of June 28, 2016 FINDINGS: The lungs are well-expanded. The interstitial markings are mildly prominent but stable. There is no alveolar infiltrate or pleural effusion. The heart and pulmonary vascularity are normal. The bony thorax is unremarkable. IMPRESSION: COPD. There is no pneumonia, CHF, nor other acute cardiopulmonary abnormality. Electronically Signed   By: David  Martinique M.D.   On: 07/02/2016 12:02   Dg Chest 2 View  Result Date: 06/28/2016 CLINICAL DATA:  Dyspnea midsternal chest pain EXAM: CHEST  2 VIEW COMPARISON:  04/24/2012 FINDINGS: The heart size and mediastinal contours are within normal limits. Both lungs are clear. The visualized skeletal structures are unremarkable. IMPRESSION: No active cardiopulmonary disease. Electronically Signed    By: Ashley Royalty M.D.   On: 06/28/2016 19:35    Microbiology: Recent Results (from the past 240 hour(s))  Culture, blood (routine x 2) Call MD if unable to obtain prior to antibiotics being given     Status: None (Preliminary result)   Collection Time: 06/28/16 11:54 PM  Result Value Ref Range Status   Specimen Description BLOOD LEFT ARM  Final   Special Requests IN PEDIATRIC BOTTLE Blood Culture adequate volume  Final   Culture NO GROWTH 4 DAYS  Final   Report Status PENDING  Incomplete  Culture, blood (routine x 2) Call MD if unable to obtain prior to antibiotics being given     Status: None (Preliminary result)   Collection Time: 06/29/16 12:03 AM  Result Value Ref Range Status   Specimen Description BLOOD LEFT ARM  Final   Special Requests   Final    BOTTLES DRAWN AEROBIC AND ANAEROBIC Blood Culture adequate volume   Culture NO GROWTH 4 DAYS  Final   Report Status PENDING  Incomplete     Labs: Basic Metabolic Panel:  Recent Labs Lab 06/28/16 1843 07/04/16 0327  NA 142 138  K 4.0 3.5  CL 105 98*  CO2 29 31  GLUCOSE 100* 117*  BUN 9 19  CREATININE 0.82 0.65  CALCIUM 8.6* 8.1*   Liver Function Tests:  Recent Labs Lab 06/28/16 1843  AST 22  ALT 21  ALKPHOS 106  BILITOT 0.1*  PROT 7.0  ALBUMIN 3.4*   No results for input(s): LIPASE, AMYLASE in the last 168 hours. No results for input(s): AMMONIA in the last 168 hours. CBC:  Recent Labs Lab 06/28/16 1843 07/04/16 0327  WBC 5.8 13.5*  NEUTROABS 4.0  --   HGB 12.8 12.7  HCT 39.4 39.5  MCV 88.7 89.4  PLT 370 363   Cardiac Enzymes:  Recent Labs Lab 06/29/16 0003 06/29/16 0819  TROPONINI <0.03 <0.03   BNP: BNP (last 3 results) No results for input(s): BNP in the last 8760 hours.  ProBNP (last 3 results) No results for input(s): PROBNP in the last 8760 hours.  CBG: No results for input(s): GLUCAP in the last 168 hours.     SignedNita Sells MD   [facility] 07/04/2016, 9:58 AM

## 2016-07-09 ENCOUNTER — Ambulatory Visit
Admission: RE | Admit: 2016-07-09 | Discharge: 2016-07-09 | Disposition: A | Discharge status: Home / Self Care | Payer: Medicaid Other | Source: Ambulatory Visit | Attending: Internal Medicine | Admitting: Internal Medicine

## 2016-07-09 DIAGNOSIS — Z1231 Encounter for screening mammogram for malignant neoplasm of breast: Secondary | ICD-10-CM

## 2017-07-18 IMAGING — DX DG CHEST 2V
2 series · 2 of 2 positions shown · non-contrast
Comparison: Chest x-ray of June 28, 2016

CLINICAL DATA: COPD exacerbation, current smoker.

EXAM:
CHEST  2 VIEW

[chest pa]
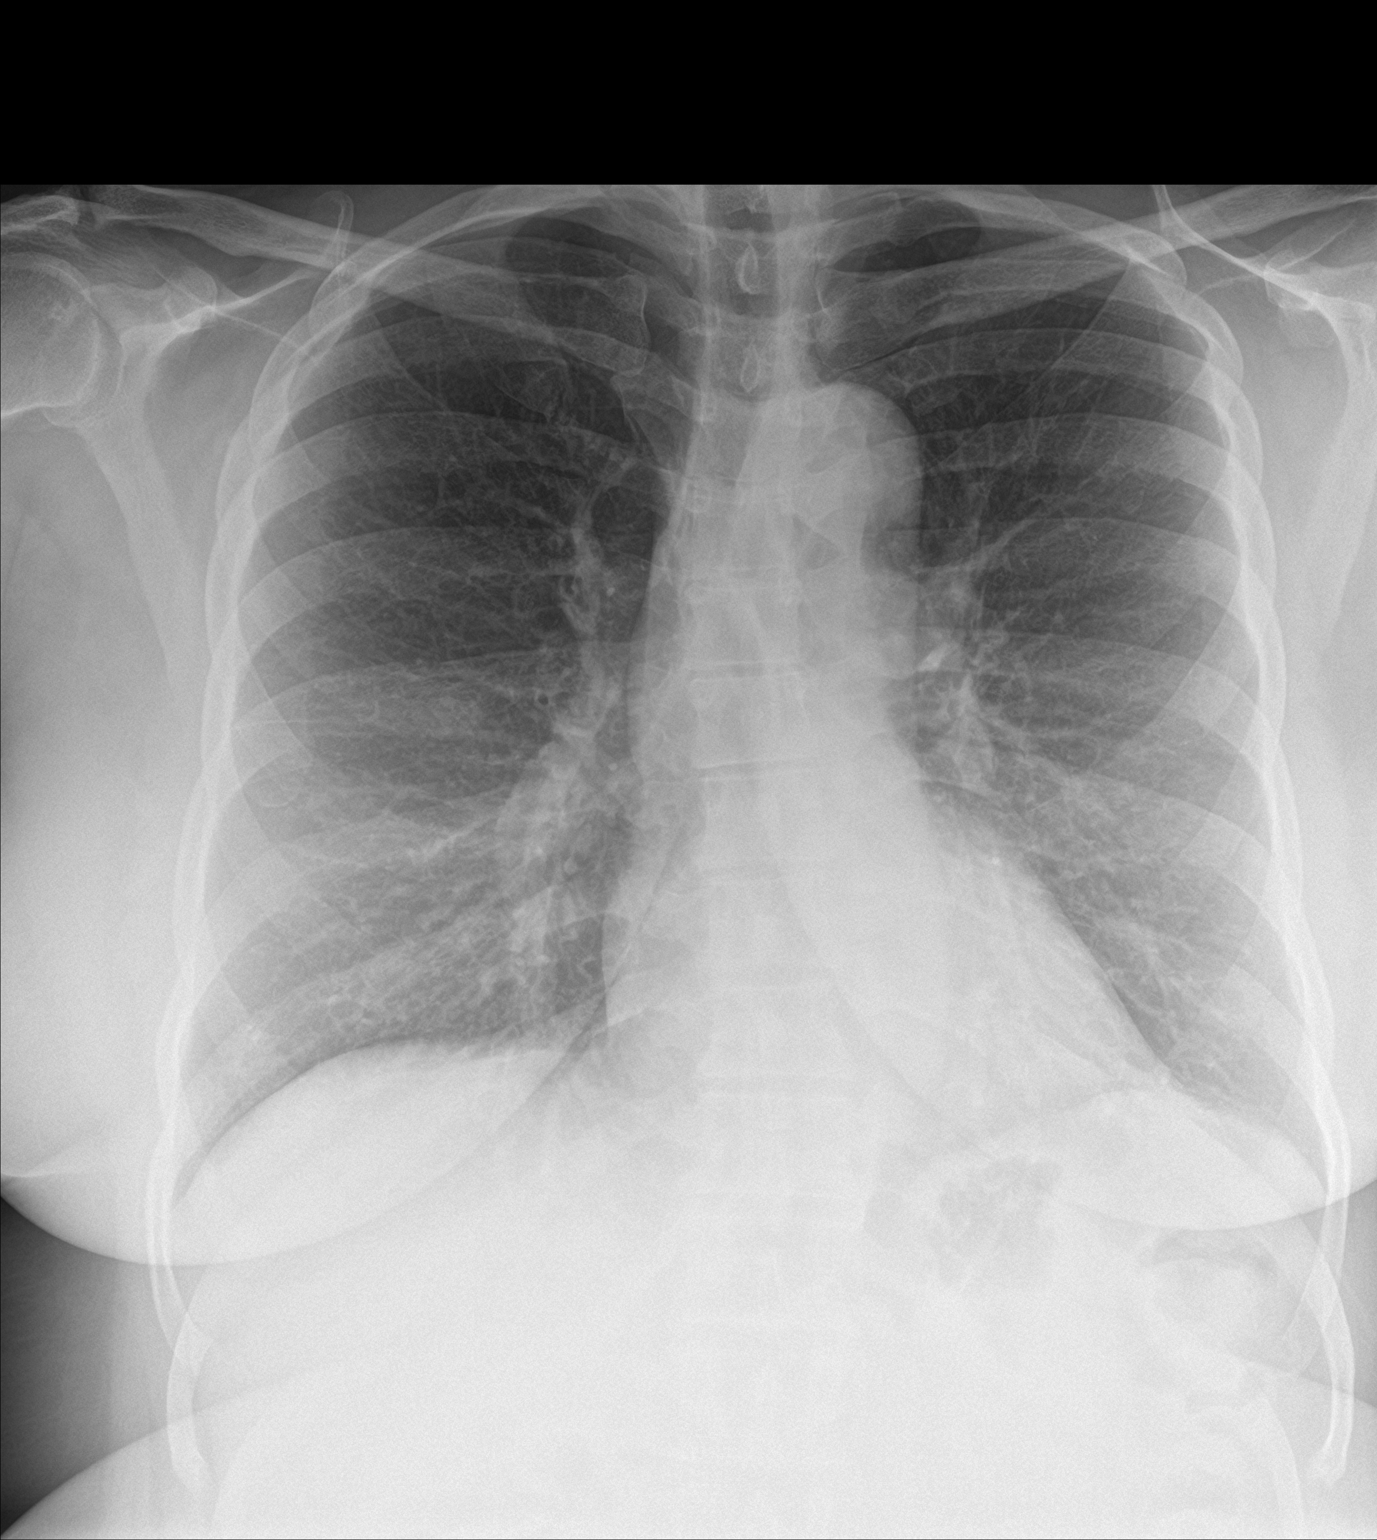

[chest lat]
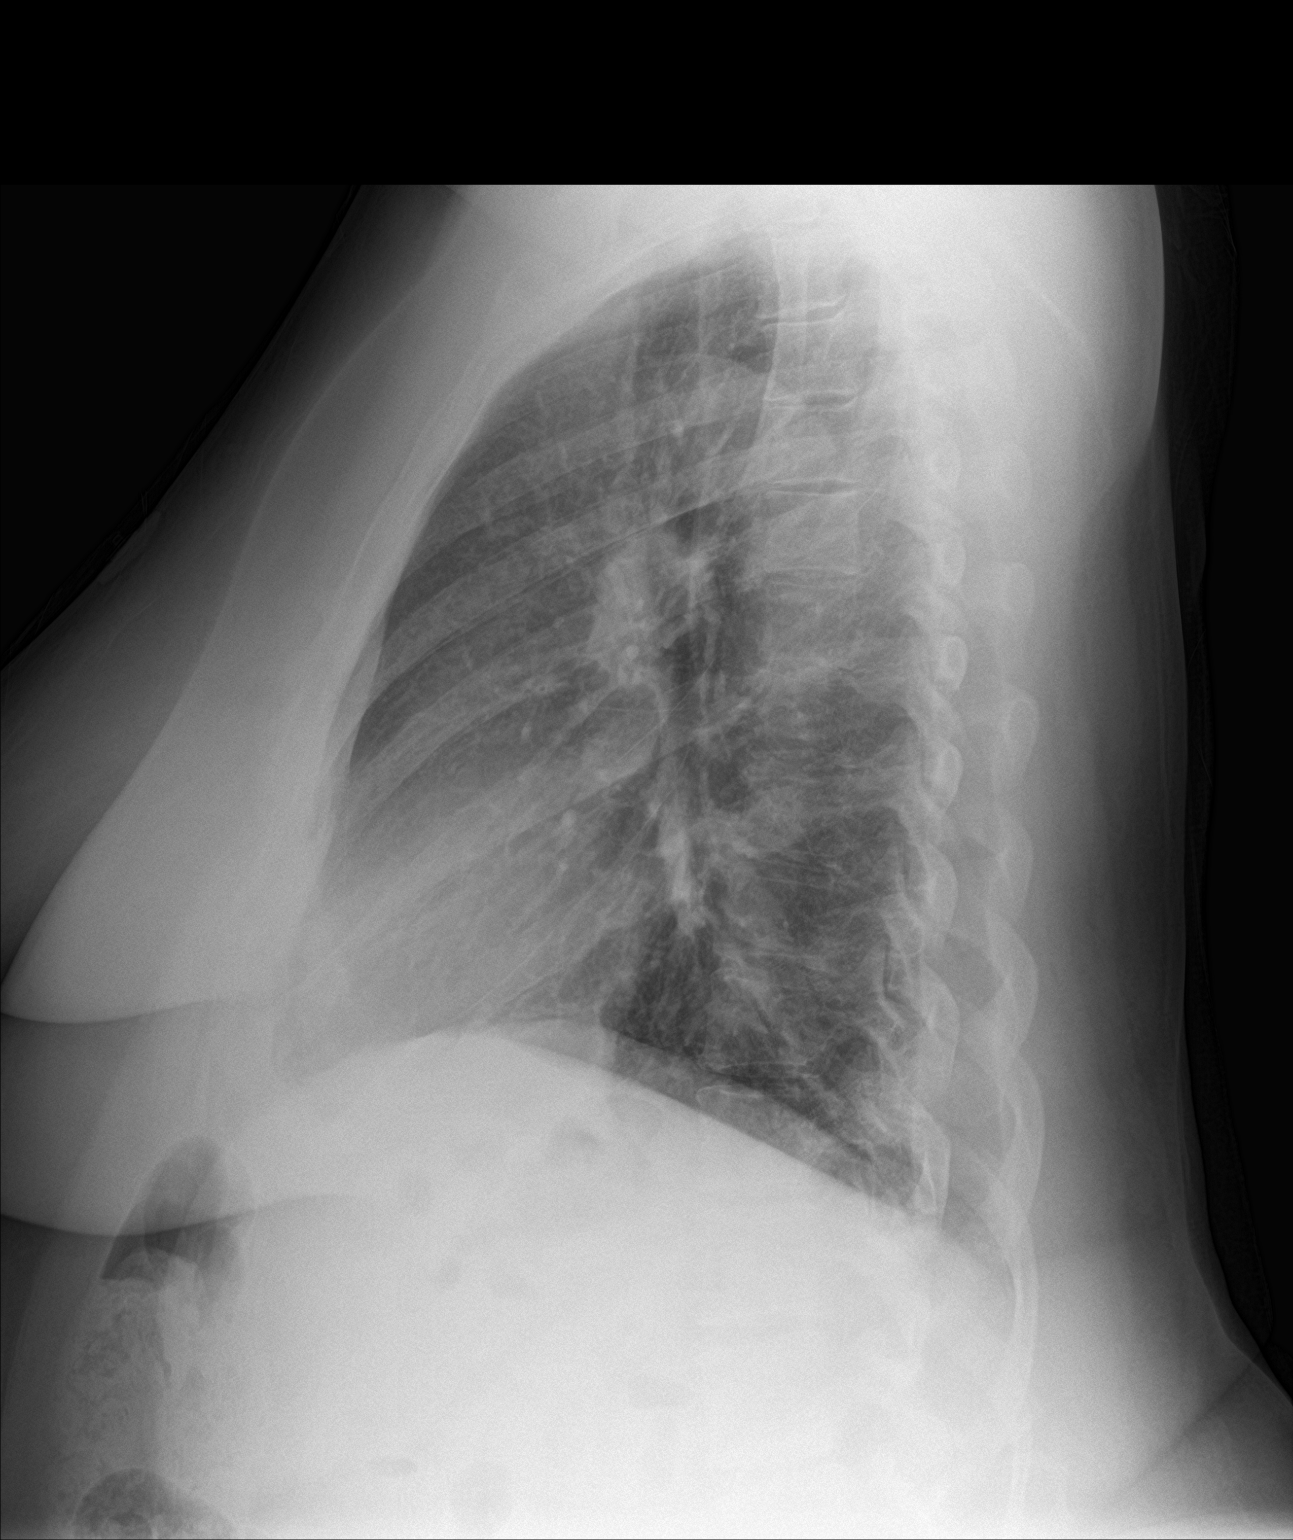

[2 of 2 positions shown; findings below may reference images not displayed]

FINDINGS: The lungs are well-expanded. The interstitial markings are mildly
prominent but stable. There is no alveolar infiltrate or pleural
effusion. The heart and pulmonary vascularity are normal. The bony
thorax is unremarkable.
IMPRESSION: COPD. There is no pneumonia, CHF, nor other acute cardiopulmonary
abnormality.
